# Patient Record
Sex: Male | Born: 1960
Health system: Southern US, Community
[De-identification: ages and names within clinical notes are randomized; demographics above are authoritative.]

## PROBLEM LIST (undated history)

## (undated) DIAGNOSIS — K4090 Unilateral inguinal hernia, without obstruction or gangrene, not specified as recurrent: Secondary | ICD-10-CM

## (undated) DIAGNOSIS — R569 Unspecified convulsions: Secondary | ICD-10-CM

## (undated) HISTORY — DX: Unspecified convulsions: R56.9

---

## 1993-04-04 HISTORY — PX: HERNIA REPAIR: SHX51

## 1997-11-26 ENCOUNTER — Ambulatory Visit (HOSPITAL_BASED_OUTPATIENT_CLINIC_OR_DEPARTMENT_OTHER): Admission: RE | Admit: 1997-11-26 | Discharge: 1997-11-26 | Payer: Self-pay

## 2001-01-24 ENCOUNTER — Emergency Department (HOSPITAL_COMMUNITY): Admission: EM | Admit: 2001-01-24 | Discharge: 2001-01-24 | Payer: Self-pay | Admitting: *Deleted

## 2003-04-05 HISTORY — PX: SHOULDER SURGERY: SHX246

## 2003-05-11 ENCOUNTER — Emergency Department (HOSPITAL_COMMUNITY): Admission: EM | Admit: 2003-05-11 | Discharge: 2003-05-11 | Payer: Self-pay | Admitting: Emergency Medicine

## 2003-05-14 ENCOUNTER — Ambulatory Visit (HOSPITAL_COMMUNITY): Admission: RE | Admit: 2003-05-14 | Discharge: 2003-05-14 | Payer: Self-pay | Admitting: Specialist

## 2003-07-17 ENCOUNTER — Emergency Department (HOSPITAL_COMMUNITY): Admission: EM | Admit: 2003-07-17 | Discharge: 2003-07-17 | Payer: Self-pay | Admitting: Emergency Medicine

## 2003-11-07 ENCOUNTER — Emergency Department (HOSPITAL_COMMUNITY): Admission: EM | Admit: 2003-11-07 | Discharge: 2003-11-07 | Payer: Self-pay | Admitting: Emergency Medicine

## 2014-10-28 ENCOUNTER — Other Ambulatory Visit: Payer: Self-pay | Admitting: Surgery

## 2014-10-28 NOTE — H&P (Signed)
Kevin Short 10/28/2014 3:34 PM Location: [facility] Patient #: [id number] DOB: 10/26/1960 Single / Language: [place] / Race: White Male History of Present Illness Ardeth Sportsman MD; 10/28/2014 4:17 PM) Patient words: RIH.  The patient is a 54 year old male who presents with an inguinal hernia. Patient sent for surgical consultation by his primary care physician, Dr. Lacie Scotts with [facility]. Concern for RIGHT inguinal hernia. Active male. Felt sharp right groin pain after heavy lifting & pulling up some carpet at home late last year. Had open inguinal hernia by Dr Orson Slick in the past (?L). Patient felt some pain and discomfort then started noticing a bulge. He went from being intermittently out all the time. It is gotten larger. He was concerned. Establish with primary care physician. Surgical consultation recommended. Patient has bowel movement every day. No severe constipation or diarrhea. Moderately active. Does not smoke. No other surgeries. No history of skin infections or other abnormalities. No difficulty with urination or prostate problems. Other Problems Fay Records, CMA; 10/28/2014 3:34 PM) Inguinal Hernia  Past Surgical History Fay Records, CMA; 10/28/2014 3:34 PM) Open Inguinal Hernia Surgery Left. Oral Surgery Shoulder Surgery Right.  Diagnostic Studies History Fay Records, [place]; 10/28/2014 3:34 PM) Colonoscopy never  Allergies Fay Records, CMA; 10/28/2014 3:34 PM) No Known Drug Allergies 10/28/2014  Medication History Fay Records, [place]; 10/28/2014 3:34 PM) No Current Medications Medications Reconciled  Social History Fay Records, [place]; 10/28/2014 3:34 PM) Alcohol use Occasional alcohol use. Caffeine use Coffee, Tea. No drug use Tobacco use Never smoker.  Family History Fay Records, [place]; 10/28/2014 3:34 PM) Heart Disease Father, Mother. Heart disease in male family member before age 65     Review of Systems  Fay Records CMA; 10/28/2014 3:34 PM) General Not Present- Appetite Loss, Chills, Fatigue, Fever, Night Sweats, Weight Gain and Weight Loss. Skin Not Present- Change in Wart/Mole, Dryness, Hives, Jaundice, New Lesions, Non-Healing Wounds, Rash and Ulcer. HEENT Present- Seasonal Allergies and Wears glasses/contact lenses. Not Present- Earache, Hearing Loss, Hoarseness, Nose Bleed, Oral Ulcers, Ringing in the Ears, Sinus Pain, Sore Throat, Visual Disturbances and Yellow Eyes. Respiratory Not Present- Bloody sputum, Chronic Cough, Difficulty Breathing, Snoring and Wheezing. Breast Not Present- Breast Mass, Breast Pain, Nipple Discharge and Skin Changes. Cardiovascular Not Present- Chest Pain, Difficulty Breathing Lying Down, Leg Cramps, Palpitations, Rapid Heart Rate, Shortness of Breath and Swelling of Extremities. Gastrointestinal Not Present- Abdominal Pain, Bloating, Bloody Stool, Change in Bowel Habits, Chronic diarrhea, Constipation, Difficulty Swallowing, Excessive gas, Gets full quickly at meals, Hemorrhoids, Indigestion, Nausea, Rectal Pain and Vomiting. Male Genitourinary Not Present- Blood in Urine, Change in Urinary Stream, Frequency, Impotence, Nocturia, Painful Urination, Urgency and Urine Leakage. Musculoskeletal Not Present- Back Pain, Joint Pain, Joint Stiffness, Muscle Pain, Muscle Weakness and Swelling of Extremities. Neurological Not Present- Decreased Memory, Fainting, Headaches, Numbness, Seizures, Tingling, Tremor, Trouble walking and Weakness. Psychiatric Not Present- Anxiety, Bipolar, Change in Sleep Pattern, Depression, Fearful and Frequent crying. Endocrine Not Present- Cold Intolerance, Excessive Hunger, Hair Changes, Heat Intolerance, Hot flashes and New Diabetes. Hematology Not Present- Easy Bruising, Excessive bleeding, Gland problems, HIV and Persistent Infections.  Vitals Fay Records CMA; 10/28/2014 3:35 PM) 10/28/2014 3:34 PM Weight: 193 lb Height: 71in Body  Surface Area: 2.09 m Body Mass Index: 26.92 kg/m Temp.: 98.81F(Oral)  Pulse: 69 (Regular)  BP: 128/72 (Sitting, Left Arm, Standard)     Physical Exam Ardeth Sportsman MD; 10/28/2014 4:18 PM)  General Mental Status-Alert. General Appearance-Not in acute distress, Not  Sickly. Orientation-Oriented X3. Hydration-Well hydrated. Voice-Normal.  Integumentary Global Assessment Upon inspection and palpation of skin surfaces of the - Axillae: non-tender, no inflammation or ulceration, no drainage. and Distribution of scalp and body hair is normal. General Characteristics Temperature - normal warmth is noted.  Head and Neck Head-normocephalic, atraumatic with no lesions or palpable masses. Face Global Assessment - atraumatic, no absence of expression. Neck Global Assessment - no abnormal movements, no bruit auscultated on the right, no bruit auscultated on the left, no decreased range of motion, non-tender. Trachea-midline. Thyroid Gland Characteristics - non-tender.  Eye Eyeball - Left-Extraocular movements intact, No Nystagmus. Eyeball - Right-Extraocular movements intact, No Nystagmus. Cornea - Left-No Hazy. Cornea - Right-No Hazy. Sclera/Conjunctiva - Left-No scleral icterus, No Discharge. Sclera/Conjunctiva - Right-No scleral icterus, No Discharge. Pupil - Left-Direct reaction to light normal. Pupil - Right-Direct reaction to light normal.  ENMT Ears Pinna - Left - no drainage observed, no generalized tenderness observed. Right - no drainage observed, no generalized tenderness observed. Nose and Sinuses External Inspection of the Nose - no destructive lesion observed. Inspection of the nares - Left - quiet respiration. Right - quiet respiration. Mouth and Throat Lips - Upper Lip - no fissures observed, no pallor noted. Lower Lip - no fissures observed, no pallor noted. Nasopharynx - no discharge present. Oral Cavity/Oropharynx - Tongue  - no dryness observed. Oral Mucosa - no cyanosis observed. Hypopharynx - no evidence of airway distress observed.  Chest and Lung Exam Inspection Movements - Normal and Symmetrical. Accessory muscles - No use of accessory muscles in breathing. Palpation Palpation of the chest reveals - Non-tender. Auscultation Breath sounds - Normal and Clear.  Cardiovascular Auscultation Rhythm - Regular. Murmurs & Other Heart Sounds - Auscultation of the heart reveals - No Murmurs and No Systolic Clicks.  Abdomen Inspection Inspection of the abdomen reveals - No Visible peristalsis and No Abnormal pulsations. Umbilicus - No Bleeding, No Urine drainage. Palpation/Percussion Palpation and Percussion of the abdomen reveal - Soft, Non Tender, No Rebound tenderness, No Rigidity (guarding) and No Cutaneous hyperesthesia. Note: Soft and flat. No diastases recti. No umbilical hernia.   Male Genitourinary Sexual Maturity Tanner 5 - Adult hair pattern and Adult penile size and shape. Note: Subtle LEFT groin incision. No LEFT inguinal hernia. Obvious RIGHT groin bulge. Ultimately reducible. Consistent with moderate RIGHT inguinal hernia. Testes and epididymides and spermatic cords appeared normal without any discrete masses or concerns.   Peripheral Vascular Upper Extremity Inspection - Left - No Cyanotic nailbeds, Not Ischemic. Right - No Cyanotic nailbeds, Not Ischemic.  Neurologic Neurologic evaluation reveals -normal attention span and ability to concentrate, able to name objects and repeat phrases. Appropriate fund of knowledge , normal sensation and normal coordination. Mental Status Affect - not angry, not paranoid. Cranial Nerves-Normal Bilaterally. Gait-Normal.  Neuropsychiatric Mental status exam performed with findings of-able to articulate well with normal speech/language, rate, volume and coherence, thought content normal with ability to perform basic computations and apply  abstract reasoning and no evidence of hallucinations, delusions, obsessions or homicidal/suicidal ideation.  Musculoskeletal Global Assessment Spine, Ribs and Pelvis - no instability, subluxation or laxity. Right Upper Extremity - no instability, subluxation or laxity.  Lymphatic Head & Neck  General Head & Neck Lymphatics: Bilateral - Description - No Localized lymphadenopathy. Axillary  General Axillary Region: Bilateral - Description - No Localized lymphadenopathy. Femoral & Inguinal  Generalized Femoral & Inguinal Lymphatics: Left - Description - No Localized lymphadenopathy. Right - Description - No Localized lymphadenopathy.  Assessment & Plan Ardeth Sportsman MD; 10/28/2014 4:19 PM)  RIGHT INGUINAL HERNIA (550.90  K40.90) Impression: Obvious RIGHT inguinal hernia. No evidence of recurrent LEFT inguinal hernia. I think he would benefit from surgical repair given his activity level, moderate size, symptoms, and relatively young age. He is interested in surgery. Would plan to do laparoscopically.  The anatomy & physiology of the abdominal wall and pelvic floor was discussed.  The pathophysiology of hernias in the inguinal and pelvic region was discussed.  Natural history risks such as progressive enlargement, pain, incarceration, and strangulation was discussed.   Contributors to complications such as smoking, obesity, diabetes, prior surgery, etc were discussed.    I feel the risks of no intervention will lead to serious problems that outweigh the operative risks; therefore, I recommended surgery to reduce and repair the hernia.  I explained laparoscopic techniques with possible need for an open approach.  I noted usual use of mesh to patch and/or buttress hernia repair  Risks such as bleeding, infection, abscess, need for further treatment, stroke, heart attack, death, and other risks were discussed.  I noted a good likelihood this will help address the problem.   Goals of  post-operative recovery were discussed as well.  Possibility that this will not correct all symptoms was explained.  I stressed the importance of low-impact activity, aggressive pain control, avoiding constipation, & not pushing through pain to minimize risk of post-operative chronic pain or injury. Possibility of reherniation was discussed.  We will work to minimize complications.     An educational handout further explaining the pathology & treatment options was given as well.  Questions were answered.  The patient expresses understanding & wishes to proceed with surgery.    Current Plans Schedule for Surgery Written instructions provided Pt Education - Pamphlet Given - Laparoscopic Hernia Repair: discussed with patient and provided information. Pt Education - [facility] Hernia Post-Op HCI (Dae Highley): discussed with patient and provided information. Pt Education - [facility] Pain Control (Darrah Dredge)  Ardeth Sportsman, M.D., F.A.C.S. Gastrointestinal and Minimally Invasive Surgery [facility] [address] [place], [place] [address] [phone] Main / Paging

## 2014-12-05 NOTE — Patient Instructions (Addendum)
YOUR PROCEDURE IS SCHEDULED ON :  12/11/14  REPORT TO [facility] MAIN ENTRANCE FOLLOW SIGNS TO EAST ELEVATOR - GO TO 3rd FLOOR CHECK IN AT 3 EAST NURSES STATION (SHORT STAY) AT:  5:30 am  CALL THIS NUMBER IF YOU HAVE PROBLEMS THE MORNING OF SURGERY [phone]  REMEMBER:ONLY 1 PER PERSON MAY GO TO SHORT STAY WITH YOU TO GET READY THE MORNING OF YOUR SURGERY  DO NOT EAT FOOD OR DRINK LIQUIDS AFTER MIDNIGHT  TAKE THESE MEDICINES THE MORNING OF SURGERY: NONE  STOP ASPIRIN / IBUPROFEN / ALEVE / VITAMINS / HERBAL MEDS __5__ DAYS BEFORE SURGERY  YOU MAY NOT HAVE ANY METAL ON YOUR BODY INCLUDING HAIR PINS AND PIERCING'S. DO NOT WEAR JEWELRY, MAKEUP, LOTIONS, POWDERS OR PERFUMES. DO NOT WEAR NAIL POLISH. DO NOT SHAVE 48 HRS PRIOR TO SURGERY. MEN MAY SHAVE FACE AND NECK.  DO NOT BRING VALUABLES TO HOSPITAL. [facility] IS NOT RESPONSIBLE FOR VALUABLES.  CONTACTS, DENTURES OR PARTIALS MAY NOT BE WORN TO SURGERY. LEAVE SUITCASE IN CAR. CAN BE BROUGHT TO ROOM AFTER SURGERY.  PATIENTS DISCHARGED THE DAY OF SURGERY WILL NOT BE ALLOWED TO DRIVE HOME.  PLEASE READ OVER THE FOLLOWING INSTRUCTION SHEETS _________________________________________________________________________________                                          [place] - PREPARING FOR SURGERY  Before surgery, you can play an important role.  Because skin is not sterile, your skin needs to be as free of germs as possible.  You can reduce the number of germs on your skin by washing with CHG (chlorahexidine gluconate) soap before surgery.  CHG is an antiseptic cleaner which kills germs and bonds with the skin to continue killing germs even after washing. Please DO NOT use if you have an allergy to CHG or antibacterial soaps.  If your skin becomes reddened/irritated stop using the CHG and inform your nurse when you arrive at Short Stay. Do not shave (including legs and underarms) for at least 48 hours prior to the  first CHG shower.  You may shave your face. Please follow these instructions carefully:   1.  Shower with CHG Soap the night before surgery and the  morning of Surgery.   2.  If you choose to wash your hair, wash your hair first as usual with your  normal  Shampoo.   3.  After you shampoo, rinse your hair and body thoroughly to remove the  shampoo.                                         4.  Use CHG as you would any other liquid soap.  You can apply chg directly  to the skin and wash . Gently wash with scrungie or clean wascloth    5.  Apply the CHG Soap to your body ONLY FROM THE NECK DOWN.   Do not use on open                           Wound or open sores. Avoid contact with eyes, ears mouth and genitals (private parts).  Genitals (private parts) with your normal soap.              6.  Wash thoroughly, paying special attention to the area where your surgery  will be performed.   7.  Thoroughly rinse your body with warm water from the neck down.   8.  DO NOT shower/wash with your normal soap after using and rinsing off  the CHG Soap .                9.  Pat yourself dry with a clean towel.             10.  Wear clean night clothes to bed after shower             11.  Place clean sheets on your bed the night of your first shower and do not  sleep with pets.  Day of Surgery : Do not apply any lotions/deodorants the morning of surgery.  Please wear clean clothes to the hospital/surgery center.  FAILURE TO FOLLOW THESE INSTRUCTIONS MAY RESULT IN THE CANCELLATION OF YOUR SURGERY    PATIENT SIGNATURE_________________________________  ______________________________________________________________________

## 2014-12-09 ENCOUNTER — Encounter (HOSPITAL_COMMUNITY): Payer: Self-pay

## 2014-12-09 ENCOUNTER — Encounter (HOSPITAL_COMMUNITY)
Admission: RE | Admit: 2014-12-09 | Discharge: 2014-12-09 | Disposition: A | Discharge status: Home / Self Care | Payer: 59 | Source: Ambulatory Visit | Attending: Surgery | Admitting: Surgery

## 2014-12-09 DIAGNOSIS — K4120 Bilateral femoral hernia, without obstruction or gangrene, not specified as recurrent: Secondary | ICD-10-CM | POA: Diagnosis not present

## 2014-12-09 DIAGNOSIS — K4020 Bilateral inguinal hernia, without obstruction or gangrene, not specified as recurrent: Secondary | ICD-10-CM | POA: Diagnosis not present

## 2014-12-09 DIAGNOSIS — K4090 Unilateral inguinal hernia, without obstruction or gangrene, not specified as recurrent: Secondary | ICD-10-CM | POA: Diagnosis present

## 2014-12-09 HISTORY — DX: Unilateral inguinal hernia, without obstruction or gangrene, not specified as recurrent: K40.90

## 2014-12-09 LAB — CBC
HEMATOCRIT: 45.1 % (ref 39.0–52.0)
HEMOGLOBIN: 15.2 g/dL (ref 13.0–17.0)
MCH: 32 pg (ref 26.0–34.0)
MCHC: 33.7 g/dL (ref 30.0–36.0)
MCV: 94.9 fL (ref 78.0–100.0)
Platelets: 221 10*3/uL (ref 150–400)
RBC: 4.75 MIL/uL (ref 4.22–5.81)
RDW: 11.9 % (ref 11.5–15.5)
WBC: 4.6 10*3/uL (ref 4.0–10.5)

## 2014-12-11 ENCOUNTER — Ambulatory Visit (HOSPITAL_COMMUNITY): Payer: 59 | Admitting: Anesthesiology

## 2014-12-11 ENCOUNTER — Encounter (HOSPITAL_COMMUNITY)
Admission: RE | Disposition: A | Discharge status: Home / Self Care | Payer: Self-pay | Source: Ambulatory Visit | Attending: Surgery

## 2014-12-11 ENCOUNTER — Ambulatory Visit (HOSPITAL_COMMUNITY)
Admission: RE | Admit: 2014-12-11 | Discharge: 2014-12-11 | Disposition: A | Discharge status: Home / Self Care | Payer: 59 | Source: Ambulatory Visit | Attending: Surgery | Admitting: Surgery

## 2014-12-11 ENCOUNTER — Encounter (HOSPITAL_COMMUNITY): Payer: Self-pay | Admitting: *Deleted

## 2014-12-11 DIAGNOSIS — K4120 Bilateral femoral hernia, without obstruction or gangrene, not specified as recurrent: Secondary | ICD-10-CM | POA: Insufficient documentation

## 2014-12-11 DIAGNOSIS — K4020 Bilateral inguinal hernia, without obstruction or gangrene, not specified as recurrent: Secondary | ICD-10-CM | POA: Insufficient documentation

## 2014-12-11 HISTORY — PX: INGUINAL HERNIA REPAIR: SHX194

## 2014-12-11 HISTORY — PX: INSERTION OF MESH: SHX5868

## 2014-12-11 SURGERY — INSERTION OF MESH
Anesthesia: General | Site: Groin | Laterality: Bilateral

## 2014-12-11 MED ORDER — ROCURONIUM BROMIDE 100 MG/10ML IV SOLN
INTRAVENOUS | Status: DC | PRN
  Administered 2014-12-11: 50 mg via INTRAVENOUS

## 2014-12-11 MED ORDER — MIDAZOLAM HCL 2 MG/2ML IJ SOLN
INTRAMUSCULAR | Status: AC
  Filled 2014-12-11: qty 4

## 2014-12-11 MED ORDER — CHLORHEXIDINE GLUCONATE 4 % EX LIQD: 1.0000 "application " | Freq: Once | CUTANEOUS | Status: DC

## 2014-12-11 MED ORDER — MIDAZOLAM HCL 5 MG/5ML IJ SOLN
INTRAMUSCULAR | Status: DC | PRN
  Administered 2014-12-11: 2 mg via INTRAVENOUS

## 2014-12-11 MED ORDER — BUPIVACAINE-EPINEPHRINE (PF) 0.25% -1:200000 IJ SOLN
INTRAMUSCULAR | Status: AC
  Filled 2014-12-11: qty 30

## 2014-12-11 MED ORDER — CEFAZOLIN SODIUM-DEXTROSE 2-3 GM-% IV SOLR
INTRAVENOUS | Status: AC
  Filled 2014-12-11: qty 50

## 2014-12-11 MED ORDER — DEXAMETHASONE SODIUM PHOSPHATE 10 MG/ML IJ SOLN
INTRAMUSCULAR | Status: AC
  Filled 2014-12-11: qty 1

## 2014-12-11 MED ORDER — KETOROLAC TROMETHAMINE 30 MG/ML IJ SOLN
INTRAMUSCULAR | Status: DC | PRN
  Administered 2014-12-11: 30 mg via INTRAVENOUS

## 2014-12-11 MED ORDER — FENTANYL CITRATE (PF) 250 MCG/5ML IJ SOLN
INTRAMUSCULAR | Status: AC
  Filled 2014-12-11: qty 25

## 2014-12-11 MED ORDER — BUPIVACAINE-EPINEPHRINE 0.25% -1:200000 IJ SOLN
INTRAMUSCULAR | Status: AC
  Filled 2014-12-11: qty 1

## 2014-12-11 MED ORDER — GLYCOPYRROLATE 0.2 MG/ML IJ SOLN
INTRAMUSCULAR | Status: DC | PRN
  Administered 2014-12-11 (×2): 0.2 mg via INTRAVENOUS

## 2014-12-11 MED ORDER — NEOSTIGMINE METHYLSULFATE 10 MG/10ML IV SOLN
INTRAVENOUS | Status: DC | PRN
  Administered 2014-12-11: 2.5 mg via INTRAVENOUS

## 2014-12-11 MED ORDER — FENTANYL CITRATE (PF) 100 MCG/2ML IJ SOLN
25.0000 ug | INTRAMUSCULAR | Status: DC | PRN
  Administered 2014-12-11 (×2): 50 ug via INTRAVENOUS

## 2014-12-11 MED ORDER — LACTATED RINGERS IV SOLN: INTRAVENOUS | Status: DC

## 2014-12-11 MED ORDER — PROPOFOL 10 MG/ML IV BOLUS
INTRAVENOUS | Status: AC
  Filled 2014-12-11: qty 20

## 2014-12-11 MED ORDER — ROCURONIUM BROMIDE 100 MG/10ML IV SOLN
INTRAVENOUS | Status: AC
  Filled 2014-12-11: qty 1

## 2014-12-11 MED ORDER — DEXAMETHASONE SODIUM PHOSPHATE 10 MG/ML IJ SOLN
INTRAMUSCULAR | Status: DC | PRN
  Administered 2014-12-11: 10 mg via INTRAVENOUS

## 2014-12-11 MED ORDER — ATROPINE SULFATE 0.1 MG/ML IJ SOLN
INTRAMUSCULAR | Status: AC
  Filled 2014-12-11: qty 10

## 2014-12-11 MED ORDER — ATROPINE SULFATE 0.4 MG/ML IJ SOLN
INTRAMUSCULAR | Status: DC | PRN
  Administered 2014-12-11: .5 mg via INTRAVENOUS

## 2014-12-11 MED ORDER — FENTANYL CITRATE (PF) 100 MCG/2ML IJ SOLN
INTRAMUSCULAR | Status: DC
Start: 2014-12-11 — End: 2014-12-11
  Filled 2014-12-11: qty 2

## 2014-12-11 MED ORDER — STERILE WATER FOR IRRIGATION IR SOLN
Status: DC | PRN
  Administered 2014-12-11: 2000 mL

## 2014-12-11 MED ORDER — LIDOCAINE HCL (CARDIAC) 20 MG/ML IV SOLN
INTRAVENOUS | Status: DC | PRN
Start: 2014-12-11 — End: 2014-12-11
  Administered 2014-12-11: 100 mg via INTRAVENOUS

## 2014-12-11 MED ORDER — CEFAZOLIN SODIUM-DEXTROSE 2-3 GM-% IV SOLR
2.0000 g | INTRAVENOUS | Status: AC
  Administered 2014-12-11: 2 g via INTRAVENOUS

## 2014-12-11 MED ORDER — LIDOCAINE HCL (CARDIAC) 20 MG/ML IV SOLN
INTRAVENOUS | Status: AC
  Filled 2014-12-11: qty 5

## 2014-12-11 MED ORDER — NAPROXEN 500 MG PO TABS: 500.0000 mg | ORAL_TABLET | Freq: Two times a day (BID) | ORAL | Status: DC

## 2014-12-11 MED ORDER — ONDANSETRON HCL 4 MG/2ML IJ SOLN
INTRAMUSCULAR | Status: AC
  Filled 2014-12-11: qty 2

## 2014-12-11 MED ORDER — FENTANYL CITRATE (PF) 100 MCG/2ML IJ SOLN
INTRAMUSCULAR | Status: DC | PRN
  Administered 2014-12-11: 50 ug via INTRAVENOUS
  Administered 2014-12-11 (×2): 100 ug via INTRAVENOUS

## 2014-12-11 MED ORDER — OXYCODONE HCL 5 MG PO TABS: 5.0000 mg | ORAL_TABLET | ORAL | Status: DC | PRN

## 2014-12-11 MED ORDER — PROPOFOL 10 MG/ML IV BOLUS
INTRAVENOUS | Status: DC | PRN
  Administered 2014-12-11: 200 mg via INTRAVENOUS

## 2014-12-11 MED ORDER — LACTATED RINGERS IV SOLN
INTRAVENOUS | Status: DC | PRN
Start: 2014-12-11 — End: 2014-12-11
  Administered 2014-12-11: 07:00:00 via INTRAVENOUS

## 2014-12-11 MED ORDER — BUPIVACAINE-EPINEPHRINE 0.25% -1:200000 IJ SOLN
INTRAMUSCULAR | Status: DC | PRN
  Administered 2014-12-11: 89 mL

## 2014-12-11 SURGICAL SUPPLY — 30 items
CABLE HIGH FREQUENCY MONO STRZ (ELECTRODE) ×2 IMPLANT
CHLORAPREP W/TINT 26ML (MISCELLANEOUS) ×3 IMPLANT
COVER SURGICAL LIGHT HANDLE (MISCELLANEOUS) ×1 IMPLANT
DECANTER SPIKE VIAL GLASS SM (MISCELLANEOUS) ×3 IMPLANT
DEVICE SECURE STRAP 25 ABSORB (INSTRUMENTS) IMPLANT
DRAPE LAPAROSCOPIC ABDOMINAL (DRAPES) ×3 IMPLANT
DRAPE WARM FLUID 44X44 (DRAPE) ×3 IMPLANT
DRSG TEGADERM 2-3/8X2-3/4 SM (GAUZE/BANDAGES/DRESSINGS) ×4 IMPLANT
DRSG TEGADERM 4X4.75 (GAUZE/BANDAGES/DRESSINGS) ×3 IMPLANT
ELECT REM PT RETURN 9FT ADLT (ELECTROSURGICAL) ×3
ELECTRODE REM PT RTRN 9FT ADLT (ELECTROSURGICAL) ×2 IMPLANT
GAUZE SPONGE 2X2 8PLY STRL LF (GAUZE/BANDAGES/DRESSINGS) ×1 IMPLANT
GLOVE ECLIPSE 8.0 STRL XLNG CF (GLOVE) ×3 IMPLANT
GLOVE INDICATOR 8.0 STRL GRN (GLOVE) ×3 IMPLANT
GOWN STRL REUS W/TWL XL LVL3 (GOWN DISPOSABLE) ×8 IMPLANT
KIT BASIN OR (CUSTOM PROCEDURE TRAY) ×3 IMPLANT
MESH ULTRAPRO 6X6 15CM15CM (Mesh General) ×4 IMPLANT
PEN SKIN MARKING BROAD (MISCELLANEOUS) ×3 IMPLANT
SCISSORS LAP 5X35 DISP (ENDOMECHANICALS) ×3 IMPLANT
SET IRRIG TUBING LAPAROSCOPIC (IRRIGATION / IRRIGATOR) IMPLANT
SLEEVE XCEL OPT CAN 5 100 (ENDOMECHANICALS) ×3 IMPLANT
SPONGE GAUZE 2X2 STER 10/PKG (GAUZE/BANDAGES/DRESSINGS) ×1
SUT MNCRL AB 4-0 PS2 18 (SUTURE) ×3 IMPLANT
SUT VIC AB 3-0 SH 27 (SUTURE) ×6
SUT VIC AB 3-0 SH 27XBRD (SUTURE) ×2 IMPLANT
TACKER 5MM HERNIA 3.5CML NAB (ENDOMECHANICALS) IMPLANT
TOWEL OR 17X26 10 PK STRL BLUE (TOWEL DISPOSABLE) ×3 IMPLANT
TRAY LAPAROSCOPIC (CUSTOM PROCEDURE TRAY) ×3 IMPLANT
TROCAR BLADELESS OPT 5 100 (ENDOMECHANICALS) ×3 IMPLANT
TROCAR XCEL BLUNT TIP 100MML (ENDOMECHANICALS) ×3 IMPLANT

## 2014-12-11 NOTE — Anesthesia Postprocedure Evaluation (Signed)
  Anesthesia Post-op Note  Patient: Kevin Short  Procedure(s) Performed: Procedure(s) (LRB): INSERTION OF MESH (Bilateral) LAPAROSCOPIC BILATERAL INGUINAL AND BILATERAL FEMEROL  HERNIA  REPAIRS WITH MESH (Bilateral)  Patient Location: PACU  Anesthesia Type: General  Level of Consciousness: awake and alert   Airway and Oxygen Therapy: Patient Spontanous Breathing  Post-op Pain: mild  Post-op Assessment: Post-op Vital signs reviewed, Patient's Cardiovascular Status Stable, Respiratory Function Stable, Patent Airway and No signs of Nausea or vomiting  Last Vitals:  Filed Vitals:   12/11/14 1045  BP: 143/81  Pulse: 62  Temp:   Resp: 16    Post-op Vital Signs: stable   Complications: No apparent anesthesia complications

## 2014-12-11 NOTE — Anesthesia Preprocedure Evaluation (Addendum)
Anesthesia Evaluation  Patient identified by MRN, date of birth, ID band Patient awake    Reviewed: Allergy & Precautions, H&P , NPO status , Patient's Chart, lab work & pertinent test results  Airway Mallampati: II  TM Distance: >3 FB Neck ROM: full    Dental no notable dental hx. (+) Dental Advisory Given, Teeth Intact   Pulmonary neg pulmonary ROS,    Pulmonary exam normal breath sounds clear to auscultation       Cardiovascular Exercise Tolerance: Good negative cardio ROS Normal cardiovascular exam Rhythm:regular Rate:Normal     Neuro/Psych negative neurological ROS  negative psych ROS   GI/Hepatic negative GI ROS, Neg liver ROS,   Endo/Other  negative endocrine ROS  Renal/GU negative Renal ROS  negative genitourinary   Musculoskeletal   Abdominal   Peds  Hematology negative hematology ROS (+)   Anesthesia Other Findings   Reproductive/Obstetrics negative OB ROS                             Anesthesia Physical Anesthesia Plan  ASA: I  Anesthesia Plan: General   Post-op Pain Management:    Induction: Intravenous  Airway Management Planned: LMA  Additional Equipment:   Intra-op Plan:   Post-operative Plan:   Informed Consent: I have reviewed the patients History and Physical, chart, labs and discussed the procedure including the risks, benefits and alternatives for the proposed anesthesia with the patient or authorized representative who has indicated his/her understanding and acceptance.   Dental Advisory Given  Plan Discussed with: CRNA and Surgeon  Anesthesia Plan Comments:         Anesthesia Quick Evaluation  

## 2014-12-11 NOTE — H&P (Signed)
Kevin Short 10/28/2014 3:34 PM Location: [facility] Patient #: [id number] DOB: 27-Feb-1961 Single / Language: [place] / Race: White Male  History of Present Illness  Patient words: RIH.  The patient is a 54 year old male who presents with an inguinal hernia. Patient sent for surgical consultation by his primary care physician, Dr. Lacie Scotts with [facility]. Concern for RIGHT inguinal hernia. Active male. Felt sharp right groin pain after heavy lifting & pulling up some carpet at home late last year. Had open inguinal hernia by Dr Orson Slick in the past (?L). Patient felt some pain and discomfort then started noticing a bulge. He went from being intermittently out all the time. It is gotten larger. He was concerned. Establish with primary care physician. Surgical consultation recommended. Patient has bowel movement every day. No severe constipation or diarrhea. Moderately active. Does not smoke. No other surgeries. No history of skin infections or other abnormalities. No difficulty with urination or prostate problems.   Other Problems Fay Records, CMA; 10/28/2014 3:34 PM) Inguinal Hernia  Past Surgical History Fay Records, CMA; 10/28/2014 3:34 PM) Open Inguinal Hernia Surgery Left. Oral Surgery Shoulder Surgery Right.  Diagnostic Studies History Fay Records, [place]; 10/28/2014 3:34 PM) Colonoscopy never  Allergies Fay Records, CMA; 10/28/2014 3:34 PM) No Known Drug Allergies07/26/2016  Medication History Fay Records, [place]; 10/28/2014 3:34 PM) No Current Medications Medications Reconciled  Social History Fay Records, [place]; 10/28/2014 3:34 PM) Alcohol use Occasional alcohol use. Caffeine use Coffee, Tea. No drug use Tobacco use Never smoker.  Family History Fay Records, [place]; 10/28/2014 3:34 PM) Heart Disease Father, Mother. Heart disease in male family member before age 55  Review of Systems Fay Records CMA; 10/28/2014 3:34  PM) General Not Present- Appetite Loss, Chills, Fatigue, Fever, Night Sweats, Weight Gain and Weight Loss. Skin Not Present- Change in Wart/Mole, Dryness, Hives, Jaundice, New Lesions, Non-Healing Wounds, Rash and Ulcer. HEENT Present- Seasonal Allergies and Wears glasses/contact lenses. Not Present- Earache, Hearing Loss, Hoarseness, Nose Bleed, Oral Ulcers, Ringing in the Ears, Sinus Pain, Sore Throat, Visual Disturbances and Yellow Eyes. Respiratory Not Present- Bloody sputum, Chronic Cough, Difficulty Breathing, Snoring and Wheezing. Breast Not Present- Breast Mass, Breast Pain, Nipple Discharge and Skin Changes. Cardiovascular Not Present- Chest Pain, Difficulty Breathing Lying Down, Leg Cramps, Palpitations, Rapid Heart Rate, Shortness of Breath and Swelling of Extremities. Gastrointestinal Not Present- Abdominal Pain, Bloating, Bloody Stool, Change in Bowel Habits, Chronic diarrhea, Constipation, Difficulty Swallowing, Excessive gas, Gets full quickly at meals, Hemorrhoids, Indigestion, Nausea, Rectal Pain and Vomiting. Male Genitourinary Not Present- Blood in Urine, Change in Urinary Stream, Frequency, Impotence, Nocturia, Painful Urination, Urgency and Urine Leakage. Musculoskeletal Not Present- Back Pain, Joint Pain, Joint Stiffness, Muscle Pain, Muscle Weakness and Swelling of Extremities. Neurological Not Present- Decreased Memory, Fainting, Headaches, Numbness, Seizures, Tingling, Tremor, Trouble walking and Weakness. Psychiatric Not Present- Anxiety, Bipolar, Change in Sleep Pattern, Depression, Fearful and Frequent crying. Endocrine Not Present- Cold Intolerance, Excessive Hunger, Hair Changes, Heat Intolerance, Hot flashes and New Diabetes. Hematology Not Present- Easy Bruising, Excessive bleeding, Gland problems, HIV and Persistent Infections.   Vitals Fay Records CMA; 10/28/2014 3:35 PM) 10/28/2014 3:34 PM Weight: 193 lb Height: 71in Body Surface Area: 2.09 m Body Mass  Index: 26.92 kg/m Temp.: 98.51F(Oral)  Pulse: 69 (Regular)  BP: 128/72 (Sitting, Left Arm, Standard)    Physical Exam Ardeth Sportsman MD; 10/28/2014 4:18 PM) General Mental Status-Alert. General Appearance-Not in acute distress, Not Sickly. Orientation-Oriented X3. Hydration-Well hydrated. Voice-Normal.  Integumentary  Global Assessment Upon inspection and palpation of skin surfaces of the - Axillae: non-tender, no inflammation or ulceration, no drainage. and Distribution of scalp and body hair is normal. General Characteristics Temperature - normal warmth is noted.  Head and Neck Head-normocephalic, atraumatic with no lesions or palpable masses. Face Global Assessment - atraumatic, no absence of expression. Neck Global Assessment - no abnormal movements, no bruit auscultated on the right, no bruit auscultated on the left, no decreased range of motion, non-tender. Trachea-midline. Thyroid Gland Characteristics - non-tender.  Eye Eyeball - Left-Extraocular movements intact, No Nystagmus. Eyeball - Right-Extraocular movements intact, No Nystagmus. Cornea - Left-No Hazy. Cornea - Right-No Hazy. Sclera/Conjunctiva - Left-No scleral icterus, No Discharge. Sclera/Conjunctiva - Right-No scleral icterus, No Discharge. Pupil - Left-Direct reaction to light normal. Pupil - Right-Direct reaction to light normal.  ENMT Ears Pinna - Left - no drainage observed, no generalized tenderness observed. Right - no drainage observed, no generalized tenderness observed. Nose and Sinuses External Inspection of the Nose - no destructive lesion observed. Inspection of the nares - Left - quiet respiration. Right - quiet respiration. Mouth and Throat Lips - Upper Lip - no fissures observed, no pallor noted. Lower Lip - no fissures observed, no pallor noted. Nasopharynx - no discharge present. Oral Cavity/Oropharynx - Tongue - no dryness observed. Oral Mucosa -  no cyanosis observed. Hypopharynx - no evidence of airway distress observed.  Chest and Lung Exam Inspection Movements - Normal and Symmetrical. Accessory muscles - No use of accessory muscles in breathing. Palpation Palpation of the chest reveals - Non-tender. Auscultation Breath sounds - Normal and Clear.  Cardiovascular Auscultation Rhythm - Regular. Murmurs & Other Heart Sounds - Auscultation of the heart reveals - No Murmurs and No Systolic Clicks.  Abdomen Inspection Inspection of the abdomen reveals - No Visible peristalsis and No Abnormal pulsations. Umbilicus - No Bleeding, No Urine drainage. Palpation/Percussion Palpation and Percussion of the abdomen reveal - Soft, Non Tender, No Rebound tenderness, No Rigidity (guarding) and No Cutaneous hyperesthesia. Note: Soft and flat. No diastases recti. No umbilical hernia.   Male Genitourinary Sexual Maturity Tanner 5 - Adult hair pattern and Adult penile size and shape. Note: Subtle LEFT groin incision. No LEFT inguinal hernia. Obvious RIGHT groin bulge. Ultimately reducible. Consistent with moderate RIGHT inguinal hernia. Testes and epididymides and spermatic cords appeared normal without any discrete masses or concerns.   Peripheral Vascular Upper Extremity Inspection - Left - No Cyanotic nailbeds, Not Ischemic. Right - No Cyanotic nailbeds, Not Ischemic.  Neurologic Neurologic evaluation reveals -normal attention span and ability to concentrate, able to name objects and repeat phrases. Appropriate fund of knowledge , normal sensation and normal coordination. Mental Status Affect - not angry, not paranoid. Cranial Nerves-Normal Bilaterally. Gait-Normal.  Neuropsychiatric Mental status exam performed with findings of-able to articulate well with normal speech/language, rate, volume and coherence, thought content normal with ability to perform basic computations and apply abstract reasoning and no evidence of  hallucinations, delusions, obsessions or homicidal/suicidal ideation.  Musculoskeletal Global Assessment Spine, Ribs and Pelvis - no instability, subluxation or laxity. Right Upper Extremity - no instability, subluxation or laxity.  Lymphatic Head & Neck  General Head & Neck Lymphatics: Bilateral - Description - No Localized lymphadenopathy. Axillary  General Axillary Region: Bilateral - Description - No Localized lymphadenopathy. Femoral & Inguinal  Generalized Femoral & Inguinal Lymphatics: Left - Description - No Localized lymphadenopathy. Right - Description - No Localized lymphadenopathy.    Assessment & Plan  RIGHT INGUINAL  HERNIA (550.90  K40.90) Impression: Obvious RIGHT inguinal hernia. No evidence of recurrent LEFT inguinal hernia. I think he would benefit from surgical repair given his activity level, moderate size, symptoms, and relatively young age. He is interested in surgery. Would plan to do laparoscopically. Current Plans  Schedule for Surgery Written instructions provided Pt Education - Pamphlet Given - Laparoscopic Hernia Repair: discussed with patient and provided information. The anatomy & physiology of the abdominal wall and pelvic floor was discussed. The pathophysiology of hernias in the inguinal and pelvic region was discussed. Natural history risks such as progressive enlargement, pain, incarceration, and strangulation was discussed. Contributors to complications such as smoking, obesity, diabetes, prior surgery, etc were discussed.  I feel the risks of no intervention will lead to serious problems that outweigh the operative risks; therefore, I recommended surgery to reduce and repair the hernia. I explained laparoscopic techniques with possible need for an open approach. I noted usual use of mesh to patch and/or buttress hernia repair  Risks such as bleeding, infection, abscess, need for further treatment, heart attack, death, and other risks were  discussed. I noted a good likelihood this will help address the problem. Goals of post-operative recovery were discussed as well. Possibility that this will not correct all symptoms was explained. I stressed the importance of low-impact activity, aggressive pain control, avoiding constipation, & not pushing through pain to minimize risk of post-operative chronic pain or injury. Possibility of reherniation was discussed. We will work to minimize complications.  An educational handout further explaining the pathology & treatment options was given as well. Questions were answered. The patient expresses understanding & wishes to proceed with surgery. Pt Education - [facility] Hernia Post-Op HCI (Kevin Short): discussed with patient and provided information. Pt Education - [facility] Pain Control (Kevin Short)   Signed by Ardeth Sportsman, MD   Ardeth Sportsman, M.D., F.A.C.S. Gastrointestinal and Minimally Invasive Surgery [facility] [address] [place], [place] [address] [phone] Main / Paging

## 2014-12-11 NOTE — Interval H&P Note (Signed)
History and Physical Interval Note:  12/11/2014 7:30 AM  Kevin Short  has presented today for surgery, with the diagnosis of Right Inguinal Hernia  The various methods of treatment have been discussed with the patient and family. After consideration of risks, benefits and other options for treatment, the patient has consented to  Procedure(s): LAPAROSCOPIC RIGHT INGUINAL HERNIA REPAIR (Right) as a surgical intervention .  The patient's history has been reviewed, patient examined, no change in status, stable for surgery.  I have reviewed the patient's chart and labs.  Questions were answered to the patient's satisfaction.     Aicia Babinski C.

## 2014-12-11 NOTE — Transfer of Care (Signed)
Immediate Anesthesia Transfer of Care Note  Patient: Kevin Short  Procedure(s) Performed: Procedure(s): INSERTION OF MESH (Bilateral) LAPAROSCOPIC BILATERAL INGUINAL AND BILATERAL FEMEROL  HERNIA  REPAIRS WITH MESH (Bilateral)  Patient Location: PACU  Anesthesia Type:General  Level of Consciousness: awake, alert  and oriented  Airway & Oxygen Therapy: Patient Spontanous Breathing and Patient connected to face mask oxygen  Post-op Assessment: Report given to RN and Post -op Vital signs reviewed and stable  Post vital signs: Reviewed and stable  Last Vitals:  Filed Vitals:   12/11/14 0544  BP: 138/90  Pulse: 62  Temp: 36.7 C  Resp: 18    Complications: No apparent anesthesia complications

## 2014-12-11 NOTE — Anesthesia Procedure Notes (Signed)
Procedure Name: Intubation Date/Time: 12/11/2014 7:43 AM Performed by: Thornell Mule Pre-anesthesia Checklist: Patient identified, Emergency Drugs available, Suction available and Patient being monitored Patient Re-evaluated:Patient Re-evaluated prior to inductionOxygen Delivery Method: Circle System Utilized Preoxygenation: Pre-oxygenation with 100% oxygen Intubation Type: IV induction Ventilation: Mask ventilation without difficulty Laryngoscope Size: Miller and 3 Grade View: Grade I Tube type: Oral Tube size: 7.5 mm Number of attempts: 1 Airway Equipment and Method: Stylet and Oral airway Placement Confirmation: ETT inserted through vocal cords under direct vision,  positive ETCO2 and breath sounds checked- equal and bilateral Secured at: 21 cm Tube secured with: Tape Dental Injury: Teeth and Oropharynx as per pre-operative assessment

## 2014-12-11 NOTE — Op Note (Signed)
12/11/2014  9:13 AM  PATIENT:  Kevin Short  54 y.o. male  Patient Care Team: Evelene Croon, MD as PCP - General (Family Medicine)  PRE-OPERATIVE DIAGNOSIS:    Right Inguinal Hernia  POST-OPERATIVE DIAGNOSIS:    Right indirect inguinal hernia Left direct inguinal hernia Bilateral femoral hernias  PROCEDURE:   INSERTION OF MESH LAPAROSCOPIC BILATERAL INGUINAL AND BILATERAL FEMORAL HERNIA  REPAIRS  SURGEON:  Surgeon(s): Karie Soda, MD  ASSISTANT: RN   ANESTHESIA:   Regional ilioinguinal and genitofemoral and spermatic cord nerve blocks with GETA  EBL:     Delay start of Pharmacological VTE agent (>24hrs) due to surgical blood loss or risk of bleeding:  no  DRAINS: NONE  SPECIMEN:  NONE  DISPOSITION OF SPECIMEN:  N/A  COUNTS:  YES  PLAN OF CARE: Discharge to home after PACU  PATIENT DISPOSITION:  PACU - hemodynamically stable.  INDICATION: Patient with symptomatic right inguinal hernia.  History of left inguinal hernia but no definite hernia there.  I recommended laparoscopic exploration and repair of hernias found.    The anatomy & physiology of the abdominal wall and pelvic floor was discussed.  The pathophysiology of hernias in the inguinal and pelvic region was discussed.  Natural history risks such as progressive enlargement, pain, incarceration & strangulation was discussed.   Contributors to complications such as smoking, obesity, diabetes, prior surgery, etc were discussed.    I feel the risks of no intervention will lead to serious problems that outweigh the operative risks; therefore, I recommended surgery to reduce and repair the hernia.  I explained laparoscopic techniques with possible need for an open approach.  I noted usual use of mesh to patch and/or buttress hernia repair  Risks such as bleeding, infection, abscess, need for further treatment, heart attack, death, and other risks were discussed.  I noted a good likelihood this will help  address the problem.   Goals of post-operative recovery were discussed as well.  Possibility that this will not correct all symptoms was explained.  I stressed the importance of low-impact activity, aggressive pain control, avoiding constipation, & not pushing through pain to minimize risk of post-operative chronic pain or injury. Possibility of reherniation was discussed.  We will work to minimize complications.     An educational handout further explaining the pathology & treatment options was given as well.  Questions were answered.  The patient expresses understanding & wishes to proceed with surgery.  OR FINDINGS: Patient had moderately large right indirect inguinal hernia.  Small but definite right femoral hernia.  No direct space nor obturator hernias.  On the left side, patient had a small but definite femoral hernia.  Small but definite direct inguinal hernia as well.  Evidence of prior indirect inguinal hernia repair with mesh.  Preperitoneal component of the mesh shrunken but repair intact on that.  No obturator hernia.  DESCRIPTION:   The patient was identified & brought into the operating room. The patient was positioned supine with arms tucked. SCDs were active during the entire case. The patient underwent general anesthesia without any difficulty.  The abdomen was prepped and draped in a sterile fashion. The patient's bladder was emptied.  A Surgical Timeout confirmed our plan.  I made a transverse incision through the inferior umbilical fold.  I made a small transverse nick through the anterior rectus fascia contralateral to the inguinal hernia side and placed a 0-vicryl stitch through the fascia.  I placed a Hasson trocar into the preperitoneal plane.  Entry was clean.  We induced carbon dioxide insufflation. Camera inspection revealed no injury.  I used a 10mm angled scope to bluntly free the peritoneum off the infraumbilical anterior abdominal wall.  I created enough of a preperitoneal  pocket to place 5mm ports into the right & left mid-abdomen into this preperitoneal cavity.  I focused attention on the right side since that was the dominant hernia side.   I used blunt & focused sharp dissection to free the peritoneum off the flank and down to the pubic rim.  I freed the anteriolateral bladder wall off the anteriolateral pelvic wall, sparing midline attachments.   I located a swath of peritoneum going into a hernia fascial defect at the internal ring consistent with an indirect inguinal hernia.  I gradually freed the peritoneal hernia sac off safely and reduced it into the preperitoneal space.  I freed the peritoneum off the spermatic vessels & vas deferens.  I freed peritoneum off the retroperitoneum along the psoas muscle.    I checked & assured hemostasis.  Patient had a definite femoral hernia on that side as well.  This patient had an obvious femoral hernia, I decided to make sure there are no hernias on the contralateral side.  I turned attention on the opposite side.  I did dissection in a similar, mirror-image fashion. The patient had a femoral hernia.  Also direct space hernia.  Patient had dense adhesions of peritoneum into the preperitoneal mesh from the prior open inguinal hernia repair.  I made sure that the vas deferens and spermatic vessels were away from that.  Did have to transect peritoneum off that.  Did a high ligation to close the hernia sac and peritoneal peritoneum as well.  I chose 15x15 cm sheets of ultra-lightweight polypropylene mesh (Ultrapro), one for each side.  I cut a single sigmoid-shaped slit ~6cm from a corner of each mesh.  I placed the meshes into the preperitoneal space & laid them as overlapping diamonds such that at the inferior points, a 6x6 cm corner flap rested in the true anterolateral pelvis, covering the obturator & femoral foramina.   I allowed the bladder to fall back and help tuck the corners of the mesh in.  The medial corners overlapped each  other across midline cephalad to the pubic rim.   This provided >2 inch coverage around the hernia.  Because the defects well covered and not particularly large, I did not need tacks to hold the mesh in place  I held the hernia sacs cephalad & evacuated carbon dioxide.  I closed the fascia  With absorbable suture.  I closed the skin using 4-0 monocryl stitch.  Sterile dressings were applied. The patient was extubated & arrived in the PACU in stable condition..  I had discussed postoperative care with the patient in the holding area.   He is no family or friends with him.  He declined for me to call anyone for him.  Instructions are written in the chart.  Ardeth Sportsman, M.D., F.A.C.S. Gastrointestinal and Minimally Invasive Surgery [facility] [address] [place], [place] [address] [phone] Main / Paging

## 2014-12-11 NOTE — Discharge Instructions (Signed)
HERNIA REPAIR: POST OP INSTRUCTIONS ° °1. DIET: Follow a light bland diet the first 24 hours after arrival home, such as soup, liquids, crackers, etc.  Be sure to include lots of fluids daily.  Avoid fast food or heavy meals as your are more likely to get nauseated.  Eat a low fat the next few days after surgery. °2. Take your usually prescribed home medications unless otherwise directed. °3. PAIN CONTROL: °a. Pain is best controlled by a usual combination of three different methods TOGETHER: °i. Ice/Heat °ii. Over the counter pain medication °iii. Prescription pain medication °b. Most patients will experience some swelling and bruising around the hernia(s) such as the bellybutton, groins, or old incisions.  Ice packs or heating pads (30-60 minutes up to 6 times a day) will help. Use ice for the first few days to help decrease swelling and bruising, then switch to heat to help relax tight/sore spots and speed recovery.  Some people prefer to use ice alone, heat alone, alternating between ice & heat.  Experiment to what works for you.  Swelling and bruising can take several weeks to resolve.   °c. It is helpful to take an over-the-counter pain medication regularly for the first few weeks.  Choose one of the following that works best for you: °i. Naproxen (Aleve, etc)  Two 220mg tabs twice a day °ii. Ibuprofen (Advil, etc) Three 200mg tabs four times a day (every meal & bedtime) °iii. Acetaminophen (Tylenol, etc) 325-650mg four times a day (every meal & bedtime) °d. A  prescription for pain medication should be given to you upon discharge.  Take your pain medication as prescribed.  °i. If you are having problems/concerns with the prescription medicine (does not control pain, nausea, vomiting, rash, itching, etc), please call us [phone] to see if we need to switch you to a different pain medicine that will work better for you and/or control your side effect better. °ii. If you need a refill on your pain  medication, please contact your pharmacy.  They will contact our office to request authorization. Prescriptions will not be filled after 5 pm or on week-ends. °4. Avoid getting constipated.  Between the surgery and the pain medications, it is common to experience some constipation.  Increasing fluid intake and taking a fiber supplement (such as Metamucil, Citrucel, FiberCon, MiraLax, etc) 1-2 times a day regularly will usually help prevent this problem from occurring.  A mild laxative (prune juice, Milk of Magnesia, MiraLax, etc) should be taken according to package directions if there are no bowel movements after 48 hours.   °5. Wash / shower every day.  You may shower over the dressings as they are waterproof.   °6. Remove your waterproof bandages 5 days after surgery.  You may leave the incision open to air.  You may replace a dressing/Band-Aid to cover the incision for comfort if you wish.  Continue to shower over incision(s) after the dressing is off. ° ° ° °7. ACTIVITIES as tolerated:   °a. You may resume regular (light) daily activities beginning the next day--such as daily self-care, walking, climbing stairs--gradually increasing activities as tolerated.  If you can walk 30 minutes without difficulty, it is safe to try more intense activity such as jogging, treadmill, bicycling, low-impact aerobics, swimming, etc. °b. Save the most intensive and strenuous activity for last such as sit-ups, heavy lifting, contact sports, etc  Refrain from any heavy lifting or straining until you are off narcotics for pain control.   °  c. DO NOT PUSH THROUGH PAIN.  Let pain be your guide: If it hurts to do something, don't do it.  Pain is your body warning you to avoid that activity for another week until the pain goes down. °d. You may drive when you are no longer taking prescription pain medication, you can comfortably wear a seatbelt, and you can safely maneuver your car and apply brakes. °e. You may have sexual intercourse  when it is comfortable.  °8. FOLLOW UP in our office °a. Please call [facility] at [phone] to set up an appointment to see your surgeon in the office for a follow-up appointment approximately 2-3 weeks after your surgery. °b. Make sure that you call for this appointment the day you arrive home to insure a convenient appointment time. °9.  IF YOU HAVE DISABILITY OR FAMILY LEAVE FORMS, BRING THEM TO THE OFFICE FOR PROCESSING.  DO NOT GIVE THEM TO YOUR DOCTOR. ° °WHEN TO CALL US [phone]: °1. Poor pain control °2. Reactions / problems with new medications (rash/itching, nausea, etc)  °3. Fever over 101.5 F (38.5 C) °4. Inability to urinate °5. Nausea and/or vomiting °6. Worsening swelling or bruising °7. Continued bleeding from incision. °8. Increased pain, redness, or drainage from the incision ° ° The clinic staff is available to answer your questions during regular business hours (8:30am-5pm).  Please don’t hesitate to call and ask to speak to one of our nurses for clinical concerns.  ° If you have a medical emergency, go to the nearest emergency room or call 911. ° A surgeon from [facility] is always on call at the hospitals in [place] ° °[facility] °[address], [address], [place], [place]  [address] ? ° [address] [address], [place], [place]   [address] °MAIN: [phone] ? TOLL FREE: [phone] ? FAX: [phone] °[email or web address] ° °Managing Pain ° °Pain after surgery or related to activity is often due to strain/injury to muscle, tendon, nerves and/or incisions.  This pain is usually short-term and will improve in a few months.  ° °Many people find it helpful to do the following things TOGETHER to help speed the process of healing and to get back to regular activity more quickly: ° °1. Avoid heavy physical activity at first °a. No lifting greater than 20 pounds at first, then increase to lifting as tolerated over the next few weeks °b. Do not “push  through” the pain.  Listen to your body and avoid positions and maneuvers than reproduce the pain.  Wait a few days before trying something more intense °c. Walking is okay as tolerated, but go slowly and stop when getting sore.  If you can walk 30 minutes without stopping or pain, you can try more intense activity (running, jogging, aerobics, cycling, swimming, treadmill, sex, sports, weightlifting, etc ) °d. Remember: If it hurts to do it, then don’t do it! ° °2. Take Anti-inflammatory medication °a. Choose ONE of the following over-the-counter medications: °i.            Acetaminophen 500mg tabs (Tylenol) 1-2 pills with every meal and just before bedtime (avoid if you have liver problems) °ii.            Naproxen 220mg tabs (ex. Aleve) 1-2 pills twice a day (avoid if you have kidney, stomach, IBD, or bleeding problems) °iii. Ibuprofen 200mg tabs (ex. Advil, Motrin) 3-4 pills with every meal and just before bedtime (avoid if you have kidney, stomach, IBD, or bleeding   problems) °b. Take with food/snack around the clock for 1-2 weeks °i. This helps the muscle and nerve tissues become less irritable and calm down faster ° °3. Use a Heating pad or Ice/Cold Pack °a. 4-6 times a day °b. May use warm bath/hottub  or showers ° °4. Try Gentle Massage and/or Stretching  °a. at the area of pain many times a day °b. stop if you feel pain - do not overdo it ° °Try these steps together to help you body heal faster and avoid making things get worse.  Doing just one of these things may not be enough.   ° °If you are not getting better after two weeks or are noticing you are getting worse, contact our office for further advice; we may need to re-evaluate you & see what other things we can do to help. ° °GETTING TO GOOD BOWEL HEALTH. °Irregular bowel habits such as constipation and diarrhea can lead to many problems over time.  Having one soft bowel movement a day is the most important way to prevent further problems.  The  anorectal canal is designed to handle stretching and feces to safely manage our ability to get rid of solid waste (feces, poop, stool) out of our body.  BUT, hard constipated stools can act like ripping concrete bricks and diarrhea can be a burning fire to this very sensitive area of our body, causing inflamed hemorrhoids, anal fissures, increasing risk is perirectal abscesses, abdominal pain/bloating, an making irritable bowel worse.     ° °The goal: ONE SOFT BOWEL MOVEMENT A DAY!  To have soft, regular bowel movements:  °• Drink plenty of fluids, consider 4-6 tall glasses of water a day.   °• Take plenty of fiber.  Fiber is the undigested part of plant food that passes into the colon, acting s “natures broom” to encourage bowel motility and movement.  Fiber can absorb and hold large amounts of water. This results in a larger, bulkier stool, which is soft and easier to pass. Work gradually over several weeks up to 6 servings a day of fiber (25g a day even more if needed) in the form of: °o Vegetables -- Root (potatoes, carrots, turnips), leafy green (lettuce, salad greens, celery, spinach), or cooked high residue (cabbage, broccoli, etc) °o Fruit -- Fresh (unpeeled skin & pulp), Dried (prunes, apricots, cherries, etc ),  or stewed ( applesauce)  °o Whole grain breads, pasta, etc (whole wheat)  °o Bran cereals  °• Bulking Agents -- This type of water-retaining fiber generally is easily obtained each day by one of the following:  °o Psyllium bran -- The psyllium plant is remarkable because its ground seeds can retain so much water. This product is available as Metamucil, Konsyl, Effersyllium, Per Diem Fiber, or the less expensive generic preparation in drug and health food stores. Although labeled a laxative, it really is not a laxative.  °o Methylcellulose -- This is another fiber derived from wood which also retains water. It is available as Citrucel. °o Polyethylene Glycol - and “artificial” fiber commonly called  Miralax or Glycolax.  It is helpful for people with gassy or bloated feelings with regular fiber °o Flax Seed - a less gassy fiber than psyllium °• No reading or other relaxing activity while on the toilet. If bowel movements take longer than 5 minutes, you are too constipated °• AVOID CONSTIPATION.  High fiber and water intake usually takes care of this.  Sometimes a laxative is needed to stimulate more frequent   bowel movements, but  °• Laxatives are not a good long-term solution as it can wear the colon out.  They can help jump-start bowels if constipated, but should be relied on constantly without discussing with your doctor °o Osmotics (Milk of Magnesia, Fleets phosphosoda, Magnesium citrate, MiraLax, GoLytely) are safer than  °o Stimulants (Senokot, Castor Oil, Dulcolax, Ex Lax)    °o Avoid taking laxatives for more than 7 days in a row. °•  IF SEVERELY CONSTIPATED, try a Bowel Retraining Program: °o Do not use laxatives.  °o Eat a diet high in roughage, such as bran cereals and leafy vegetables.  °o Drink six (6) ounces of prune or apricot juice each morning.  °o Eat two (2) large servings of stewed fruit each day.  °o Take one (1) heaping tablespoon of a psyllium-based bulking agent twice a day. Use sugar-free sweetener when possible to avoid excessive calories.  °o Eat a normal breakfast.  °o Set aside 15 minutes after breakfast to sit on the toilet, but do not strain to have a bowel movement.  °o If you do not have a bowel movement by the third day, use an enema and repeat the above steps.  °• Controlling diarrhea °o Switch to liquids and simpler foods for a few days to avoid stressing your intestines further. °o Avoid dairy products (especially milk & ice cream) for a short time.  The intestines often can lose the ability to digest lactose when stressed. °o Avoid foods that cause gassiness or bloating.  Typical foods include beans and other legumes, cabbage, broccoli, and dairy foods.  Every person has  some sensitivity to other foods, so listen to our body and avoid those foods that trigger problems for you. °o Adding fiber (Citrucel, Metamucil, psyllium, Miralax) gradually can help thicken stools by absorbing excess fluid and retrain the intestines to act more normally.  Slowly increase the dose over a few weeks.  Too much fiber too soon can backfire and cause cramping & bloating. °o Probiotics (such as active yogurt, Align, etc) may help repopulate the intestines and colon with normal bacteria and calm down a sensitive digestive tract.  Most studies show it to be of mild help, though, and such products can be costly. °o Medicines: °- Bismuth subsalicylate (ex. Kayopectate, Pepto Bismol) every 30 minutes for up to 6 doses can help control diarrhea.  Avoid if pregnant. °- Loperamide (Immodium) can slow down diarrhea.  Start with two tablets (4mg total) first and then try one tablet every 6 hours.  Avoid if you are having fevers or severe pain.  If you are not better or start feeling worse, stop all medicines and call your doctor for advice °o Call your doctor if you are getting worse or not better.  Sometimes further testing (cultures, endoscopy, X-ray studies, bloodwork, etc) may be needed to help diagnose and treat the cause of the diarrhea. ° °TROUBLESHOOTING IRREGULAR BOWELS °1) Avoid extremes of bowel movements (no bad constipation/diarrhea) °2) Miralax 17gm mixed in 8oz. water or juice-daily. May use BID as needed.  °3) Gas-x,Phazyme, etc. as needed for gas & bloating.  °4) Soft,bland diet. No spicy,greasy,fried foods.  °5) Prilosec over-the-counter as needed  °6) May hold gluten/wheat products from diet to see if symptoms improve.  °7)  May try probiotics (Align, Activa, etc) to help calm the bowels down °7) If symptoms become worse call back immediately. ° °

## 2014-12-12 ENCOUNTER — Encounter (HOSPITAL_COMMUNITY): Payer: Self-pay | Admitting: Surgery

## 2017-10-24 ENCOUNTER — Ambulatory Visit (HOSPITAL_COMMUNITY)
Admission: EM | Admit: 2017-10-24 | Discharge: 2017-10-24 | Disposition: A | Discharge status: Home / Self Care | Payer: 59 | Source: Home / Self Care

## 2017-10-24 ENCOUNTER — Encounter (HOSPITAL_COMMUNITY): Payer: Self-pay | Admitting: Emergency Medicine

## 2017-10-24 ENCOUNTER — Emergency Department (HOSPITAL_COMMUNITY): Payer: 59

## 2017-10-24 ENCOUNTER — Emergency Department (HOSPITAL_COMMUNITY)
Admission: EM | Admit: 2017-10-24 | Discharge: 2017-10-24 | Disposition: A | Discharge status: Home / Self Care | Payer: 59 | Attending: Emergency Medicine | Admitting: Emergency Medicine

## 2017-10-24 ENCOUNTER — Other Ambulatory Visit: Payer: Self-pay

## 2017-10-24 DIAGNOSIS — Z79899 Other long term (current) drug therapy: Secondary | ICD-10-CM | POA: Insufficient documentation

## 2017-10-24 DIAGNOSIS — S4991XA Unspecified injury of right shoulder and upper arm, initial encounter: Secondary | ICD-10-CM | POA: Diagnosis present

## 2017-10-24 DIAGNOSIS — Y9384 Activity, sleeping: Secondary | ICD-10-CM | POA: Insufficient documentation

## 2017-10-24 DIAGNOSIS — Y92009 Unspecified place in unspecified non-institutional (private) residence as the place of occurrence of the external cause: Secondary | ICD-10-CM | POA: Diagnosis not present

## 2017-10-24 DIAGNOSIS — X500XXA Overexertion from strenuous movement or load, initial encounter: Secondary | ICD-10-CM | POA: Insufficient documentation

## 2017-10-24 DIAGNOSIS — S43014A Anterior dislocation of right humerus, initial encounter: Secondary | ICD-10-CM | POA: Diagnosis not present

## 2017-10-24 DIAGNOSIS — S43004A Unspecified dislocation of right shoulder joint, initial encounter: Secondary | ICD-10-CM

## 2017-10-24 DIAGNOSIS — Y999 Unspecified external cause status: Secondary | ICD-10-CM | POA: Diagnosis not present

## 2017-10-24 MED ORDER — LIDOCAINE-EPINEPHRINE 1 %-1:100000 IJ SOLN
20.0000 mL | Freq: Once | INTRAMUSCULAR | Status: AC
  Administered 2017-10-24: 20 mL via INTRADERMAL
  Filled 2017-10-24: qty 20

## 2017-10-24 MED ORDER — PROPOFOL 10 MG/ML IV BOLUS
0.5000 mg/kg | Freq: Once | INTRAVENOUS | Status: AC
  Administered 2017-10-24: 43.6 mg via INTRAVENOUS
  Filled 2017-10-24: qty 20

## 2017-10-24 MED ORDER — ONDANSETRON HCL 4 MG/2ML IJ SOLN
4.0000 mg | Freq: Once | INTRAMUSCULAR | Status: AC
  Administered 2017-10-24: 4 mg via INTRAVENOUS
  Filled 2017-10-24: qty 2

## 2017-10-24 MED ORDER — HYDROMORPHONE HCL 1 MG/ML IJ SOLN
1.0000 mg | Freq: Once | INTRAMUSCULAR | Status: AC
  Administered 2017-10-24: 1 mg via INTRAVENOUS
  Filled 2017-10-24: qty 1

## 2017-10-24 NOTE — ED Notes (Signed)
Pt at baseline upon discharge. Ambulatory with no s/sx distress

## 2017-10-24 NOTE — ED Triage Notes (Signed)
Pt. Stated, I woke up and my shoulder is hurting , I think I dislocated it. I have had surgery to repair it, it use to happen all the time.

## 2017-10-24 NOTE — ED Notes (Signed)
Pt signed consent for procedural sedation as well as to have right shoulder reduced

## 2017-10-24 NOTE — ED Notes (Signed)
Pt ambulated to bathroom x1 assist.  

## 2017-10-24 NOTE — Discharge Instructions (Addendum)
Follow up with your ortho.   Take 4 over the counter ibuprofen tablets 3 times a day or 2 over-the-counter naproxen tablets twice a day for pain. Also take tylenol 1000mg (2 extra strength) four times a day.

## 2017-10-24 NOTE — ED Notes (Signed)
Ortho tech paged  

## 2017-10-24 NOTE — ED Notes (Signed)
PA at bedside updating patient at this time.   

## 2017-10-24 NOTE — ED Notes (Signed)
Patient states he does not have a ride or way home other than to drive.  This RN spoke with PA who states to medicate patient regardless

## 2017-10-24 NOTE — ED Provider Notes (Signed)
[facility] EM[facility]GENCY [facility]MENT Provider Note   CSN: [id number] Arrival date & time: 10/24/17  1011     History   Chief Complaint Chief Complaint  Patient presents with  . Shoulder Pain    HPI Kevin Short is a 57 y.o. male.  57 yo M with a chief complaint of right shoulder pain.  The patient thinks that he dislocated when he rolled over in bed.  Had a history of dislocation in the past had a surgical repair where the glenoid was increased surgically for him.  He does not remember who performed the procedure.  Thinks it was about 15 years ago.  Denies recent trauma.  Denies numbness or tingling.  The history is provided by the patient.  Illness  This is a new problem. The current episode started 1 to 2 hours ago. The problem occurs constantly. The problem has not changed since onset.Pertinent negatives include no chest pain, no abdominal pain, no headaches and no shortness of breath. The symptoms are aggravated by bending and twisting. Nothing relieves the symptoms. He has tried nothing for the symptoms.    Past Medical History:  Diagnosis Date  . Inguinal hernia    RIGHT    There are no active problems to display for this patient.   Past Surgical History:  Procedure Laterality Date  . HERNIA REPAIR  1995   LEFT INGUINAL  . INGUINAL HERNIA REPAIR Bilateral 12/11/2014   Procedure: LAPAROSCOPIC BILATERAL INGUINAL AND BILATERAL FEMEROL  HERNIA  REPAIRS WITH MESH;  Surgeon: Karie SodaSteven Gross, MD;  Location: [facility];  Service: General;  Laterality: Bilateral;  . INSERTION OF MESH Bilateral 12/11/2014   Procedure: INSERTION OF MESH;  Surgeon: Karie SodaSteven Gross, MD;  Location: [facility];  Service: General;  Laterality: Bilateral;  . SHOULDER SURGERY  2005   RT        Home Medications    Prior to Admission medications   Medication Sig Start Date End Date Taking? Authorizing Provider  diphenhydrAMINE (SOMINEX) 25 MG tablet Take 25 mg by mouth at bedtime as needed for  allergies or sleep.    [provider]  Multiple Vitamin (MULTIVITAMIN WITH MINERALS) TABS tablet Take 1 tablet by mouth every morning.    [provider]  naproxen (NAPROSYN) 500 MG tablet Take 1 tablet (500 mg total) by mouth 2 (two) times daily with a meal. 12/11/14   Karie SodaGross, Steven, MD  oxyCODONE (OXY IR/ROXICODONE) 5 MG immediate release tablet Take 1-2 tablets (5-10 mg total) by mouth every 4 (four) hours as needed for moderate pain, severe pain or breakthrough pain. 12/11/14   Karie SodaGross, Steven, MD    Family History No family history on file.  Social History Social History   Tobacco Use  . Smoking status: Never Smoker  . Smokeless tobacco: Never Used  Substance Use Topics  . Alcohol use: Yes    Comment: OCCASIONAL  . Drug use: No     Allergies   Patient has no known allergies.   Review of Systems Review of Systems  Constitutional: Negative for chills and fever.  HENT: Negative for congestion and facial swelling.   Eyes: Negative for discharge and visual disturbance.  Respiratory: Negative for shortness of breath.   Cardiovascular: Negative for chest pain and palpitations.  Gastrointestinal: Negative for abdominal pain, diarrhea and vomiting.  Musculoskeletal: Positive for arthralgias and myalgias.  Skin: Negative for color change and rash.  Neurological: Negative for tremors, syncope and headaches.  Psychiatric/Behavioral: Negative for confusion  and dysphoric mood.     Physical Exam Updated Vital Signs BP 133/89   Pulse 74   Temp 98.7 F (37.1 C) (Oral)   Resp 17   Ht 5\' 11"  (1.803 m)   Wt 87.1 kg (192 lb)   SpO2 94%   BMI 26.78 kg/m   Physical Exam  Constitutional: He appears well-developed and well-nourished.  HENT:  Head: Normocephalic and atraumatic.  Eyes: Pupils are equal, round, and reactive to light. EOM are normal.  Neck: Normal range of motion. Neck supple. No JVD present.  Cardiovascular: Normal rate and regular rhythm. Exam  reveals no gallop and no friction rub.  No murmur heard. Pulmonary/Chest: No respiratory distress. He has no wheezes.  Abdominal: He exhibits no distension. There is no rebound and no guarding.  Musculoskeletal: Normal range of motion. He exhibits tenderness and deformity.  Deformity to the right shoulder.  Neurological: He is alert.  Patient is arousable only to pain actively drooling protecting his airway  Skin: No rash noted. No pallor.  Psychiatric: He has a normal mood and affect. His behavior is normal.  Nursing note and vitals reviewed.    ED Treatments / Results  Labs (all labs ordered are listed, but only abnormal results are displayed) Labs Reviewed - No data to display  EKG None  Radiology Dg Shoulder Right  Result Date: 10/24/2017 CLINICAL DATA:  History of shoulder dislocations, now with shoulder pain. EXAM: RIGHT SHOULDER - 2+ VIEW COMPARISON:  Right shoulder radiographs - 11/07/2003; 05/11/2003 FINDINGS: There is a recurrent anterior inferior dislocation of the glenohumeral joint. Note is again made of focal concavity involving the posterosuperior aspect of the humeral head suggestive of a Hill-Sachs deformity (seen best on the provided scapular Y radiograph). Limited visualization of the adjacent acromioclavicular joint appears normal. No evidence of calcific tendinitis. Limited visualization of the adjacent thorax is normal. 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 Electronically Signed   By: Simonne Come M.D.   On: 10/24/2017 11:06    Procedures .Joint Aspiration/Arthrocentesis Date/Time: 10/24/2017 2:26 PM Performed by: Melene Plan, DO Authorized by: Melene Plan, DO   Consent:    Consent obtained:  Verbal   Consent given by:  Patient   Risks discussed:  Bleeding and infection   Alternatives discussed:  No treatment and delayed treatment Location:    Location:   Shoulder   Shoulder:  R glenohumeral Anesthesia (see MAR for exact dosages):    Anesthesia method:  Local infiltration   Local anesthetic:  Lidocaine 2% WITH epi Procedure details:    Preparation: Patient was prepped and draped in usual sterile fashion     Needle gauge: 21.   Ultrasound guidance: no     Approach:  Lateral   Steroid injected: no     Specimen collected: no   Post-procedure details:    Dressing:  Adhesive bandage   Patient tolerance of procedure:  Tolerated well, no immediate complications Reduction of dislocation Date/Time: 10/24/2017 2:26 PM Performed by: Melene Plan, DO Authorized by: Melene Plan, DO  Consent: Verbal consent obtained. Written consent obtained. Consent given by: patient Patient understanding: patient states understanding of the procedure being performed Patient consent: the patient's understanding of the procedure matches consent given Imaging studies: imaging studies available Required items: required blood products, implants, devices, and special equipment available Patient identity confirmed: verbally with patient Time out: Immediately prior to procedure a "time out" was called to verify the correct patient,  procedure, equipment, support staff and site/side marked as required. Local anesthesia used: yes Anesthesia: local infiltration and see MAR for details  Anesthesia: Local anesthesia used: yes Local Anesthetic: lidocaine 2% with epinephrine Anesthetic total: 10 mL  Sedation: Patient sedated: yes Sedation type: moderate (conscious) sedation Sedatives: propofol Vitals: Vital signs were monitored during sedation.  Patient tolerance: Patient tolerated the procedure well with no immediate complications  .Sedation Date/Time: 10/24/2017 2:27 PM Performed by: Melene Plan, DO Authorized by: Melene Plan, DO   Consent:    Consent obtained:  Written and verbal   Consent given by:  Patient   Risks discussed:  Inadequate sedation, nausea,  vomiting, respiratory compromise necessitating ventilatory assistance and intubation and prolonged sedation necessitating reversal   Alternatives discussed:  Analgesia without sedation Universal protocol:    Immediately prior to procedure a time out was called: yes     Patient identity confirmation method:  Arm band and verbally with patient Indications:    Procedure performed:  Dislocation reduction   Procedure necessitating sedation performed by:  Physician performing sedation   Intended level of sedation:  Moderate (conscious sedation) Pre-sedation assessment:    Time since last food or drink:  Unknown   NPO status caution: unable to specify NPO status     ASA classification: class 1 - normal, healthy patient     Neck mobility: normal     Mouth opening:  2 finger widths   Thyromental distance:  3 finger widths   Mallampati score:  II - soft palate, uvula, fauces visible   Pre-sedation assessments completed and reviewed: airway patency, cardiovascular function, hydration status, mental status, nausea/vomiting, pain level, respiratory function and temperature   Immediate pre-procedure details:    Reviewed: vital signs, relevant labs/tests and NPO status     Verified: bag valve mask available, emergency equipment available, intubation equipment available, IV patency confirmed, oxygen available and suction available   Procedure details (see MAR for exact dosages):    Preoxygenation:  Nasal cannula   Sedation:  Propofol   Analgesia: dilaudid.   Intra-procedure monitoring:  Blood pressure monitoring, cardiac monitor, continuous pulse oximetry, frequent LOC assessments and frequent vital sign checks   Intra-procedure events: none     Total Provider sedation time (minutes):  35 Post-procedure details:    Post-sedation assessments completed and reviewed: airway patency, cardiovascular function, hydration status, mental status, nausea/vomiting, pain level, respiratory function and temperature      Patient is stable for discharge or admission: yes     Patient tolerance:  Tolerated well, no immediate complications   (including critical care time)  Medications Ordered in ED Medications  HYDROmorphone (DILAUDID) injection 1 mg (1 mg Intravenous Given 10/24/17 1138)  ondansetron (ZOFRAN) injection 4 mg (4 mg Intravenous Given 10/24/17 1137)  lidocaine-EPINEPHrine (XYLOCAINE W/EPI) 1 %-1:100000 (with pres) injection 20 mL (20 mLs Intradermal Given by Other 10/24/17 1321)  propofol (DIPRIVAN) 10 mg/mL bolus/IV push 43.6 mg (43.6 mg Intravenous Given by Other 10/24/17 1321)     Initial Impression / Assessment and Plan / ED Course  I have reviewed the triage vital signs and the nursing notes.  Pertinent labs & imaging results that were available during my care of the patient were reviewed by me and considered in my medical decision making (see chart for details).     57 yo M with a chief complaint of a right shoulder dislocation.  Initially history was difficult secondary to the patient receiving pain medicines prior to coming back to the room.  He had a very limited GCS but was protecting his airway.  I was able to wake him up enough to consent him for conscious sedation as I was unable to relocate the shoulder with just a local block. Plain film with anterior R shoulder dislocation as viewed by me.   Relocated with park maneuver without difficulty.  PMS intact post procedure.  D/c home. Ortho follow up.   2:29 PM:  I have discussed the diagnosis/risks/treatment options with the patient and believe the pt to be eligible for discharge home to follow-up with ortho. We also discussed returning to the ED immediately if new or worsening sx occur. We discussed the sx which are most concerning (e.g., sudden worsening pain, fever, inability to tolerate by mouth) that necessitate immediate return. Medications administered to the patient during their visit and any new prescriptions provided to the  patient are listed below.  Medications given during this visit Medications  HYDROmorphone (DILAUDID) injection 1 mg (1 mg Intravenous Given 10/24/17 1138)  ondansetron (ZOFRAN) injection 4 mg (4 mg Intravenous Given 10/24/17 1137)  lidocaine-EPINEPHrine (XYLOCAINE W/EPI) 1 %-1:100000 (with pres) injection 20 mL (20 mLs Intradermal Given by Other 10/24/17 1321)  propofol (DIPRIVAN) 10 mg/mL bolus/IV push 43.6 mg (43.6 mg Intravenous Given by Other 10/24/17 1321)      The patient appears reasonably screen and/or stabilized for discharge and I doubt any other medical condition or other [facility] requiring further screening, evaluation, or treatment in the ED at this time prior to discharge.    Final Clinical Impressions(s) / ED Diagnoses   Final diagnoses:  Shoulder dislocation, right, initial encounter    ED Discharge Orders    None       Melene Plan, DO 10/24/17 1429

## 2017-10-24 NOTE — ED Provider Notes (Signed)
MSE was initiated and I personally evaluated the patient and placed orders (if any) at  11:19 AM on October 24, 2017.  Right shoulder pain, rolled over bed this morning and had pain in the right shoulder, tried to massage the area which he thought was helping however when he got up this morning he noticed his shoulder was likely dislocated again.  Patient reports multiple dislocations previously but none since 2005 when he had surgery to correct this, does not see orthopedics regularly and does not recall who did his surgery in 2005.  Patient ate breakfast (bowl of cereal) at 6 AM today.  On exam right shoulder deformity, neurovascular intact.  The patient appears stable so that the remainder of the MSE may be completed by another provider.   Jeannie FendMurphy, Laura A, PA-C 10/24/17 1120    Cathren LaineSteinl, Kevin, MD 10/24/17 1249

## 2018-04-25 DIAGNOSIS — R9431 Abnormal electrocardiogram [ECG] [EKG]: Secondary | ICD-10-CM | POA: Diagnosis not present

## 2018-04-25 DIAGNOSIS — R404 Transient alteration of awareness: Secondary | ICD-10-CM | POA: Diagnosis not present

## 2018-04-25 DIAGNOSIS — R410 Disorientation, unspecified: Secondary | ICD-10-CM | POA: Diagnosis not present

## 2018-06-22 ENCOUNTER — Other Ambulatory Visit: Payer: Self-pay

## 2018-06-22 ENCOUNTER — Emergency Department (HOSPITAL_COMMUNITY)
Admission: EM | Admit: 2018-06-22 | Discharge: 2018-06-22 | Disposition: A | Discharge status: Home / Self Care | Payer: 59 | Attending: Emergency Medicine | Admitting: Emergency Medicine

## 2018-06-22 ENCOUNTER — Emergency Department (HOSPITAL_COMMUNITY): Payer: 59

## 2018-06-22 ENCOUNTER — Encounter (HOSPITAL_COMMUNITY): Payer: Self-pay

## 2018-06-22 DIAGNOSIS — R000 Tachycardia, unspecified: Secondary | ICD-10-CM | POA: Diagnosis not present

## 2018-06-22 DIAGNOSIS — G40909 Epilepsy, unspecified, not intractable, without status epilepticus: Secondary | ICD-10-CM | POA: Insufficient documentation

## 2018-06-22 DIAGNOSIS — R569 Unspecified convulsions: Secondary | ICD-10-CM | POA: Diagnosis not present

## 2018-06-22 DIAGNOSIS — R4182 Altered mental status, unspecified: Secondary | ICD-10-CM | POA: Diagnosis not present

## 2018-06-22 LAB — COMPREHENSIVE METABOLIC PANEL
ALT: 21 U/L (ref 0–44)
AST: 27 U/L (ref 15–41)
Albumin: 4.9 g/dL (ref 3.5–5.0)
Alkaline Phosphatase: 42 U/L (ref 38–126)
Anion gap: 27 — ABNORMAL HIGH (ref 5–15)
BUN: 11 mg/dL (ref 6–20)
CALCIUM: 9.8 mg/dL (ref 8.9–10.3)
CO2: 13 mmol/L — AB (ref 22–32)
Chloride: 102 mmol/L (ref 98–111)
Creatinine, Ser: 1.22 mg/dL (ref 0.61–1.24)
GFR calc Af Amer: >60 mL/min (ref >60)
GFR calc non Af Amer: >60 mL/min (ref >60)
Glucose, Bld: 131 mg/dL — ABNORMAL HIGH (ref 70–99)
Potassium: 3.6 mmol/L (ref 3.5–5.1)
Sodium: 142 mmol/L (ref 135–145)
Total Bilirubin: 0.8 mg/dL (ref 0.3–1.2)
Total Protein: 7.7 g/dL (ref 6.5–8.1)

## 2018-06-22 LAB — CBC WITH DIFFERENTIAL/PLATELET
Abs Immature Granulocytes: 0.04 10*3/uL (ref 0.00–0.07)
Basophils Absolute: 0.1 10*3/uL (ref 0.0–0.1)
Basophils Relative: 1 %
Eosinophils Absolute: 0.3 10*3/uL (ref 0.0–0.5)
Eosinophils Relative: 4 %
HCT: 50.7 % (ref 39.0–52.0)
Hemoglobin: 16.1 g/dL (ref 13.0–17.0)
Immature Granulocytes: 1 %
Lymphocytes Relative: 36 %
Lymphs Abs: 3.1 10*3/uL (ref 0.7–4.0)
MCH: 32.1 pg (ref 26.0–34.0)
MCHC: 31.8 g/dL (ref 30.0–36.0)
MCV: 101 fL — ABNORMAL HIGH (ref 80.0–100.0)
Monocytes Absolute: 0.8 10*3/uL (ref 0.1–1.0)
Monocytes Relative: 9 %
NEUTROS ABS: 4.2 10*3/uL (ref 1.7–7.7)
NRBC: 0 % (ref 0.0–0.2)
Neutrophils Relative %: 49 %
Platelets: 236 10*3/uL (ref 150–400)
RBC: 5.02 MIL/uL (ref 4.22–5.81)
RDW: 11.5 % (ref 11.5–15.5)
WBC: 8.4 10*3/uL (ref 4.0–10.5)

## 2018-06-22 LAB — CBG MONITORING, ED: Glucose-Capillary: 107 mg/dL — ABNORMAL HIGH (ref 70–99)

## 2018-06-22 LAB — ETHANOL: Alcohol, Ethyl (B): <10 mg/dL (ref <10)

## 2018-06-22 LAB — MAGNESIUM: Magnesium: 2.3 mg/dL (ref 1.7–2.4)

## 2018-06-22 MED ORDER — LEVETIRACETAM IN NACL 1000 MG/100ML IV SOLN
1000.0000 mg | Freq: Once | INTRAVENOUS | Status: AC
  Administered 2018-06-22: 1000 mg via INTRAVENOUS
  Filled 2018-06-22: qty 100

## 2018-06-22 MED ORDER — MAGNESIUM SULFATE 2 GM/50ML IV SOLN: 2.0000 g | INTRAVENOUS | Status: DC

## 2018-06-22 MED ORDER — LEVETIRACETAM 500 MG PO TABS: 500.0000 mg | ORAL_TABLET | Freq: Two times a day (BID) | ORAL | 3 refills | Status: DC

## 2018-06-22 MED ORDER — LORAZEPAM 2 MG/ML IJ SOLN
INTRAMUSCULAR | Status: AC
  Administered 2018-06-22: 1 mg
  Filled 2018-06-22: qty 1

## 2018-06-22 NOTE — ED Triage Notes (Signed)
Pt was seen in our cafeteria by a tech to appear to be having a seizure. Pt was eased to the floor and then proceeded to seize for 90 seconds. No known hx of seizures at this time. Upon arrival to the room pt appears to be postictal, following commands but disoriented. VSS

## 2018-06-22 NOTE — ED Provider Notes (Signed)
[facility]   CSN: [id number] Arrival date & time: 06/22/18  1236    History   Chief Complaint Chief Complaint  Patient presents with   Seizures    HPI Kevin Short is a 58 y.o. male.     HPI  The patient is a 58 year old male, he has an unknown medical history as he is presenting here for the first time by himself after he was found to have a seizure in the cafeteria today.  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  This was witnessed by an emergency department staff member who was standing near him when he fumbled around bounced into the wall and then had tonic-clonic activity lasting approximately 90 seconds.  Since then the patient has improved, as he is not seizing any longer however he is not able to answer questions.  Level 5 caveat applies.  History reviewed. No pertinent past medical history.  There are no active problems to display for this patient.   History reviewed. No pertinent surgical history.      Home Medications    Prior to Admission medications   Medication Sig Start Date End Date Taking? Authorizing Provider  levETIRAcetam (KEPPRA) 500 MG tablet Take 1 tablet (500 mg total) by mouth 2 (two) times daily for 30 days. 06/22/18 07/22/18  Eber HongMiller, Donnielle Addison, MD    Family History History reviewed. No pertinent family history.  Social History Social History   Tobacco Use   Smoking status: Unknown If Ever Smoked  Substance Use Topics   Alcohol use: Never    Frequency: Never   Drug use: Never     Allergies   Patient has no known allergies.   Review of Systems Review of Systems  Unable to perform ROS: Mental status change     Physical Exam Updated Vital Signs BP 126/81    Pulse 77    Temp 98.6 F (37 C) (Oral)    Resp 14    Ht 1.778 m (5\' 10" )    Wt 90.7 kg    SpO2 99%    BMI 28.70  kg/m   Physical Exam Vitals signs and nursing note reviewed.  Constitutional:      General: He is not in acute distress.    Appearance: He is well-developed.  HENT:     Head: Normocephalic and atraumatic.     Mouth/Throat:     Pharynx: No oropharyngeal exudate.  Eyes:     General: No scleral icterus.       Right eye: No discharge.        Left eye: No discharge.     Conjunctiva/sclera: Conjunctivae normal.     Pupils: Pupils are equal, round, and reactive to light.  Neck:     Musculoskeletal: Normal range of motion and neck supple.     Thyroid: No thyromegaly.     Vascular: No JVD.  Cardiovascular:     Rate and Rhythm: Regular rhythm. Tachycardia present.     Heart sounds: Normal heart sounds. No murmur. No friction rub. No gallop.      Comments: Heart rate of 103 Pulmonary:     Effort: Pulmonary effort is normal. No respiratory distress.     Breath sounds: Normal breath sounds. No wheezing or rales.  Abdominal:     General: Bowel sounds are normal. There is no distension.  Palpations: Abdomen is soft. There is no mass.     Tenderness: There is no abdominal tenderness.  Musculoskeletal: Normal range of motion.        General: No tenderness.  Lymphadenopathy:     Cervical: No cervical adenopathy.  Skin:    General: Skin is warm and dry.     Findings: No erythema or rash.  Neurological:     Mental Status: He is alert.     Coordination: Coordination normal.     Comments: Pupils are approximately 3 mm and reactive bilaterally, he has normal extraocular movements, he can follow commands but has difficulty processing the questions.  His speech seems clear and there is no facial droop.  He has 5 out of 5 strength in all 4 extremities.  He is postictal and confused  Psychiatric:        Behavior: Behavior normal.      ED Treatments / Results  Labs (all labs ordered are listed, but only abnormal results are displayed) Labs Reviewed  CBC WITH DIFFERENTIAL/PLATELET -  Abnormal; Notable for the following components:      Result Value   MCV 101.0 (*)    All other components within normal limits  COMPREHENSIVE METABOLIC PANEL - Abnormal; Notable for the following components:   CO2 13 (*)    Glucose, Bld 131 (*)    Anion gap 27 (*)    All other components within normal limits  CBG MONITORING, ED - Abnormal; Notable for the following components:   Glucose-Capillary 107 (*)    All other components within normal limits  ETHANOL  MAGNESIUM  RAPID URINE DRUG SCREEN, HOSP PERFORMED    EKG EKG Interpretation  Date/Time:  Friday June 22 2018 12:40:54 EDT Ventricular Rate:  100 PR Interval:    QRS Duration: 81 QT Interval:  336 QTC Calculation: 434 R Axis:   53 Text Interpretation:  Sinus tachycardia No old tracing to compare Confirmed by Eber Hong ([id number]) on 06/22/2018 1:18:35 PM   Radiology Ct Head Wo Contrast  Result Date: 06/22/2018 CLINICAL DATA:  Altered level of consciousness.  Possible seizure. EXAM: CT HEAD WITHOUT CONTRAST TECHNIQUE: Contiguous axial images were obtained from the base of the skull through the vertex without intravenous contrast. COMPARISON:  None. FINDINGS: Brain: No evidence of acute infarction, hemorrhage, hydrocephalus, extra-axial collection or mass lesion/mass effect. Vascular: Negative for hyperdense vessel Skull: Negative Sinuses/Orbits: Mild mucosal edema right maxillary sinus otherwise clear. Negative orbit Other: None IMPRESSION: Negative CT head Electronically Signed   By: Marlan Palau M.D.   On: 06/22/2018 13:20    Procedures Procedures (including critical care time)  Medications Ordered in ED Medications  LORazepam (ATIVAN) 2 MG/ML injection (1 mg  Given 06/22/18 1246)  levETIRAcetam (KEPPRA) IVPB 1000 mg/100 mL premix (1,000 mg Intravenous New Bag/Given 06/22/18 1451)     Initial Impression / Assessment and Plan / ED Course  I have reviewed the triage vital signs and the nursing notes.  Pertinent  labs & imaging results that were available during my care of the patient were reviewed by me and considered in my medical decision making (see chart for details).    The patient appears to have a new onset seizure, we have no other information, family is not available at the time, he will go to CT scan of the brain, labs ordered, CBG 82.  Ativan given and seizure precautions taken.  Patient reexamined at 1:11 PM at which time he is more awake and alert and able  to tell me that he has had episodes in the past where he has had some seizure-like activity and despite having a CT scan and an MRI many years ago he has never been diagnosed with anything specific.  He states this happens every couple of years.  He does endorse having a couple of drinks every night when he gets home from work but denies that he is stop drinking and his last drink was last night.  He was feeling fine when he woke up this morning but upon arriving to work he started to feel "fuzzy headed", when he was in the cafeteria it became worse and that is the last thing that he remembers.  He denies headaches chest pain shortness of breath palpitations or focal neurologic symptoms.  At this time he has a normal neurologic exam and is back to his normal baseline.  Gust with neurology, they recommend outpatient follow-up with neurology, no driving, load with Keppra and home with same.  Patient informed, stable for discharge, expressed understanding regarding rules of no driving.  He also stated that he was likely not to stop driving because that was the source of his livelihood getting back and forth to work.  I reiterated what the law was, the patient expressed his understanding.  Final Clinical Impressions(s) / ED Diagnoses   Final diagnoses:  Seizure [facility])    ED Discharge Orders         Ordered    levETIRAcetam (KEPPRA) 500 MG tablet  2 times daily     06/22/18 1509           Eber Hong, MD 06/22/18 1512

## 2018-06-22 NOTE — ED Notes (Signed)
Pt verbalized understanding of d/c instructions and has no further questions. 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 Pt to follow up with neuro and pcp. VSS. NAD. Pt d/c to the waiting room to wait for his ride.

## 2018-06-22 NOTE — ED Notes (Signed)
CBG collected. Result "107." RN, Lenora Boys., notified.

## 2018-06-22 NOTE — Discharge Instructions (Addendum)
Please call the office of the neurologist as listed above to make an appointment for a follow-up.  You should be seen at the next available follow-up.  Your testing today revealed a normal-appearing brain on the CT scan as well as normal-appearing blood work.  Please be aware that it is illegal for you to drive a vehicle until you are cleared by a neurologist.  Please take Keppra, 500 mg by mouth twice a day until you follow-up with a neurologist.  I have provided you with a 1 month prescription and 3 refills.  If you have recurrent seizures despite taking this medication you should return to the emergency department immediately.

## 2018-06-22 NOTE — ED Notes (Signed)
Pt taken to CT by Trish Mage

## 2018-06-25 ENCOUNTER — Encounter (HOSPITAL_COMMUNITY): Payer: Self-pay | Admitting: Emergency Medicine

## 2018-07-16 MED FILL — levETIRAcetam 500 MG TABS: 500 | 30 days supply | Qty: 60 | Fill #0

## 2018-08-16 MED FILL — levETIRAcetam 500 MG TABS: 500 | 30 days supply | Qty: 60 | Fill #1

## 2018-09-03 ENCOUNTER — Telehealth: Payer: Self-pay | Admitting: Neurology

## 2018-09-03 NOTE — Telephone Encounter (Signed)
Due to current COVID 19 pandemic, our office is severely reducing in office visits until further notice, in order to minimize the risk to our patients and healthcare providers.  °  °Called patient and confirmed a virtual visit for his 6/4 appointment. Patient verbalized understanding of the [email or web address] process and I have sent him an e-mail with link and directions as well as my name and office number/hours for reference. Patient understands that he will receive a call from RN to update chart. °  °Pt understands that although there may be some limitations with this type of visit, we will take all precautions to reduce any security or privacy concerns.  Pt understands that this will be treated like an in office visit and we will file with pt's insurance, and there may be a patient responsible charge related to this service. °  °

## 2018-09-05 ENCOUNTER — Encounter: Payer: Self-pay | Admitting: Neurology

## 2018-09-05 NOTE — Addendum Note (Signed)
Addended by: Ann Maki T on: 09/05/2018 03:22 PM   Modules accepted: Orders

## 2018-09-05 NOTE — Telephone Encounter (Signed)
I contacted the pt and updated EMR for 09/06/18 visit.  Pt confirmed link has been received. Pt is being evaluated for seizures, referred by ED.

## 2018-09-06 ENCOUNTER — Ambulatory Visit (INDEPENDENT_AMBULATORY_CARE_PROVIDER_SITE_OTHER): Payer: 59 | Admitting: Neurology

## 2018-09-06 ENCOUNTER — Encounter: Payer: Self-pay | Admitting: Neurology

## 2018-09-06 ENCOUNTER — Other Ambulatory Visit: Payer: Self-pay

## 2018-09-06 DIAGNOSIS — G40909 Epilepsy, unspecified, not intractable, without status epilepticus: Secondary | ICD-10-CM

## 2018-09-06 MED ORDER — LEVETIRACETAM 500 MG PO TABS: 500.0000 mg | ORAL_TABLET | Freq: Two times a day (BID) | ORAL | 1 refills | Status: DC

## 2018-09-06 NOTE — Progress Notes (Signed)
Virtual Visit via Video Note  I connected with Kevin Short on 09/06/18 at  3:00 PM EDT by a video enabled telemedicine application and verified that I am speaking with the correct person using two identifiers.  Location: Patient: The patient is at home. Provider: The physician is in office.   I discussed the limitations of evaluation and management by telemedicine and the availability of in person appointments. The patient expressed understanding and agreed to proceed.  History of Present Illness: Kevin Short is a 58 year old right-handed white male with a history of primarily nocturnal seizure since he was around 58 years old.  The patient indicates that he generally will have 2 or 3 seizure episodes a year, he apparently has never been placed on any anticonvulsant medications until just recently.  All of his seizures have been at night while sleeping, and they are generally brought on by sleep deprivation or stress.  The patient will wake up having bitten his tongue and have soreness in the calf muscles of the legs.  The patient had his first daytime seizure on 22 June 2018.  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  He was going down to get lunch at the cafeteria, he then suddenly felt dizzy and flushed.  The patient apparently bumped against the wall and then started having a generalized tonic-clonic seizure.  The patient was taken to the emergency room, a CT scan of the brain was done and was normal.  A urine drug screen was not done.  The patient was placed on Keppra 500 mg twice daily.  He has been sent to this office for further evaluation.  The patient denies any headaches or dizziness, or numbness or weakness of the extremities.  He denies issues controlling the bowels or the bladder or difficulty with balance.  He denies any family history of seizures.  He denies any prior head trauma or concussion.  He is tolerating the Keppra well currently.  The last  seizure prior to the 1 in March 2020 was in October 2019.  The patient went from 2014-2019 without any seizures.   Observations/Objective: The video evaluation reveals that the patient is alert and cooperative.  Speech is well enunciated, not aphasic or dysarthric.  He has a symmetric face, he is able to protrude the tongue in the midline with good lateral movement of the tongue.  Extraocular movements are full.  He has good finger-nose-finger and heel-to-shin bilaterally.  Gait is normal.  Tandem gait is normal.  Romberg is negative.  Assessment and Plan: 1.  History of seizure disorder with recent recurrence  The patient has mainly nocturnal seizures but he has had a recent seizure during the daytime.  I have indicated he is not operate a motor vehicle until around 23 December 2018.  He is to remain on Keppra, I will send in a prescription.  The patient will have an EEG study.  He will follow-up in 4 months.  Follow Up Instructions: 52-month follow-up, may see nurse practitioner.   I discussed the assessment and treatment plan with the patient. The patient was provided an opportunity to ask questions and all were answered. The patient agreed with the plan and demonstrated an understanding of the instructions.   The patient was advised to call back or seek an in-person evaluation if the symptoms worsen or if the condition fails to improve as anticipated.  I provided 30 minutes of non-face-to-face time during this  encounter.   Kathrynn Ducking, MD

## 2018-09-11 ENCOUNTER — Telehealth: Payer: Self-pay | Admitting: Neurology

## 2018-09-11 NOTE — Telephone Encounter (Signed)
Called patient and LVM to schedule a 4 month follow-up with NP per Dr. Jannifer Franklin. Patient also needs EEG scheduled. I gave my direct line for reference.

## 2018-09-12 NOTE — Telephone Encounter (Signed)
Pt. Called and scheduled both appts.

## 2018-09-12 NOTE — Telephone Encounter (Signed)
Thank you :)

## 2018-09-14 MED FILL — levETIRAcetam 500 MG TABS: 500 | 90 days supply | Qty: 180 | Fill #0

## 2018-09-26 ENCOUNTER — Telehealth: Payer: Self-pay | Admitting: Neurology

## 2018-09-26 ENCOUNTER — Other Ambulatory Visit: Payer: Self-pay

## 2018-09-26 ENCOUNTER — Ambulatory Visit: Payer: 59 | Admitting: Neurology

## 2018-09-26 DIAGNOSIS — G40909 Epilepsy, unspecified, not intractable, without status epilepticus: Secondary | ICD-10-CM | POA: Diagnosis not present

## 2018-09-26 NOTE — Telephone Encounter (Signed)
I called the patient.  EEG study was unremarkable.  The patient will stay on Keppra, he will let me know if any further events are noted.

## 2018-09-26 NOTE — Procedures (Signed)
    History:  Kevin Short is a 58 year old gentleman with a history of nocturnal seizures since age 83.  The patient had his first daytime seizure on 22 June 2018.  He felt dizzy and flushed and then bumped against the wall and had a generalized tonic-clonic seizure.  The patient is being evaluated for this issue.  This is a routine EEG.  No skull defects are noted.  Medications include Keppra and multivitamins.  EEG classification: Normal awake  Description of the recording: The background rhythms of this recording consists of a fairly well modulated medium amplitude alpha rhythm of 9 Hz that is reactive to eye opening and closure. As the record progresses, the patient appears to remain in the waking state throughout the recording. Photic stimulation was performed, resulting in a bilateral and symmetric photic driving response. Hyperventilation was also performed, resulting in a minimal buildup of the background rhythm activities without significant slowing seen. At no time during the recording does there appear to be evidence of spike or spike wave discharges or evidence of focal slowing. EKG monitor shows no evidence of cardiac rhythm abnormalities with a heart rate of 66.  Impression: This is a normal EEG recording in the waking state. No evidence of ictal or interictal discharges are seen.

## 2018-12-11 MED FILL — levETIRAcetam 500 MG TABS: 500 | 90 days supply | Qty: 180 | Fill #1

## 2019-01-15 NOTE — Progress Notes (Signed)
PATIENT: Kevin Short DOB: May 01, 1960  REASON FOR VISIT: follow up HISTORY FROM: patient  HISTORY OF PRESENT ILLNESS: Today 01/16/19  Kevin Short is a 58 year old male with history of primarily nocturnal seizures since he was 59 years old.  On average he may have 2 or 3 seizures a year.  He went many years without being on anticonvulsant medications.  All of his seizures have been while sleeping, brought on by sleep deprivation or stress.  He had his first daytime seizure on June 22, 2018.  He had a witnessed generalized tonic-clonic seizure at that time.  He was taken to the emergency room and placed on Keppra.  He had an EEG study in June 2020 that was unremarkable.  Since last seen, he has not had recurrent seizure.  He remains on Keppra and is tolerating well.  He says he would know if he had a nocturnal seizure, because he would have stiffness in his calves and soreness to his tongue.  He has resumed operating a motor vehicle.  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  He presents today for follow-up unaccompanied.  HISTORY 09/06/2018 Dr. Jannifer Franklin: Kevin Short is a 58 year old right-handed white male with a history of primarily nocturnal seizure since he was around 58 years old.  The patient indicates that he generally will have 2 or 3 seizure episodes a year, he apparently has never been placed on any anticonvulsant medications until just recently.  All of his seizures have been at night while sleeping, and they are generally brought on by sleep deprivation or stress.  The patient will wake up having bitten his tongue and have soreness in the calf muscles of the legs.  The patient had his first daytime seizure on 22 June 2018.  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  He was going down to get lunch at the cafeteria, he then suddenly felt dizzy and flushed.  The patient apparently bumped against the wall and then started having a generalized  tonic-clonic seizure.  The patient was taken to the emergency room, a CT scan of the brain was done and was normal.  A urine drug screen was not done.  The patient was placed on Keppra 500 mg twice daily.  He has been sent to this office for further evaluation.  The patient denies any headaches or dizziness, or numbness or weakness of the extremities.  He denies issues controlling the bowels or the bladder or difficulty with balance.  He denies any family history of seizures.  He denies any prior head trauma or concussion.  He is tolerating the Keppra well currently.  The last seizure prior to the 1 in March 2020 was in October 2019.  The patient went from 2014-2019 without any seizures.  REVIEW OF SYSTEMS: Out of a complete 14 system review of symptoms, the patient complains only of the following symptoms, and all other reviewed systems are negative.  Seizures  ALLERGIES: No Known Allergies  HOME MEDICATIONS: Outpatient Medications Prior to Visit  Medication Sig Dispense Refill  . Multiple Vitamin (MULTIVITAMIN WITH MINERALS) TABS tablet Take 1 tablet by mouth every morning.    . levETIRAcetam (KEPPRA) 500 MG tablet Take 1 tablet (500 mg total) by mouth 2 (two) times daily. 180 tablet 1   No facility-administered medications prior to visit.     PAST MEDICAL HISTORY: Past Medical History:  Diagnosis Date  . Inguinal hernia  RIGHT  . Seizures (HCC)     PAST SURGICAL HISTORY: Past Surgical History:  Procedure Laterality Date  . HERNIA REPAIR  1995   LEFT INGUINAL  . INGUINAL HERNIA REPAIR Bilateral 12/11/2014   Procedure: LAPAROSCOPIC BILATERAL INGUINAL AND BILATERAL FEMEROL  HERNIA  REPAIRS WITH MESH;  Surgeon: Karie Soda, MD;  Location: [facility];  Service: General;  Laterality: Bilateral;  . INSERTION OF MESH Bilateral 12/11/2014   Procedure: INSERTION OF MESH;  Surgeon: Karie Soda, MD;  Location: [facility];  Service: General;  Laterality: Bilateral;  . SHOULDER SURGERY  2005   RT     FAMILY HISTORY: Family History  Problem Relation Age of Onset  . Heart attack Mother   . Heart attack Father   . Healthy Sister   . Healthy Sister   . Healthy Sister     SOCIAL HISTORY: Social History   Socioeconomic History  . Marital status: Single    Spouse name: Not on file  . Number of children: Not on file  . Years of education: Not on file  . Highest education level: Not on file  Occupational History  . Not on file  Social Needs  . Financial resource strain: Not on file  . Food insecurity    Worry: Not on file    Inability: Not on file  . Transportation needs    Medical: Not on file    Non-medical: Not on file  Tobacco Use  . Smoking status: Never Smoker  . Smokeless tobacco: Never Used  Substance and Sexual Activity  . Alcohol use: Yes    Alcohol/week: 3.0 standard drinks    Types: 3 Cans of beer per week    Frequency: Never    Comment: OCCASIONAL  . Drug use: Never  . Sexual activity: Not on file  Lifestyle  . Physical activity    Days per week: Not on file    Minutes per session: Not on file  . Stress: Not on file  Relationships  . Social Musician on phone: Not on file    Gets together: Not on file    Attends religious service: Not on file    Active member of club or organization: Not on file    Attends meetings of clubs or organizations: Not on file    Relationship status: Not on file  . Intimate partner violence    Fear of current or ex partner: Not on file    Emotionally abused: Not on file    Physically abused: Not on file    Forced sexual activity: Not on file  Other Topics Concern  . Not on file  Social History Narrative   ** Merged History Encounter **       PHYSICAL EXAM  Vitals:   01/16/19 1254  BP: 139/85  Pulse: (!) 57  Temp: 97.9 F (36.6 C)  TempSrc: Oral  Weight: 196 lb 6.4 oz (89.1 kg)  Height: 5\' 10"  (1.778 m)   Body mass index is 28.18 kg/m.  Generalized: Well developed, in no acute distress    Neurological examination  Mentation: Alert oriented to time, place, history taking. Follows all commands speech and language fluent Cranial nerve II-XII: Pupils were equal round reactive to light. Extraocular movements were full, visual field were full on confrontational test. Facial sensation and strength were normal. Head turning and shoulder shrug  were normal and symmetric. Motor: The motor testing reveals 5 over 5 strength of all 4 extremities. Good symmetric  motor tone is noted throughout.  Sensory: Sensory testing is intact to soft touch on all 4 extremities. No evidence of extinction is noted.  Coordination: Cerebellar testing reveals good finger-nose-finger and heel-to-shin bilaterally.  Gait and station: Gait is normal. Tandem gait is normal. Romberg is negative. No drift is seen.  Reflexes: Deep tendon reflexes are symmetric and normal bilaterally.   DIAGNOSTIC DATA (LABS, IMAGING, TESTING) - I reviewed patient records, labs, notes, testing and imaging myself where available.  Lab Results  Component Value Date   WBC 8.4 06/22/2018   HGB 16.1 06/22/2018   HCT 50.7 06/22/2018   MCV 101.0 (H) 06/22/2018   PLT 236 06/22/2018      Component Value Date/Time   NA 142 06/22/2018 1255   K 3.6 06/22/2018 1255   CL 102 06/22/2018 1255   CO2 13 (L) 06/22/2018 1255   GLUCOSE 131 (H) 06/22/2018 1255   BUN 11 06/22/2018 1255   CREATININE 1.22 06/22/2018 1255   CALCIUM 9.8 06/22/2018 1255   PROT 7.7 06/22/2018 1255   ALBUMIN 4.9 06/22/2018 1255   AST 27 06/22/2018 1255   ALT 21 06/22/2018 1255   ALKPHOS 42 06/22/2018 1255   BILITOT 0.8 06/22/2018 1255   GFRNONAA >60 06/22/2018 1255   GFRAA >60 06/22/2018 1255   No results found for: CHOL, HDL, LDLCALC, LDLDIRECT, TRIG, CHOLHDL No results found for: [id number] No results found for: VITAMINB12 No results found for: TSH   ASSESSMENT AND PLAN 58 y.o. year old male  has a past medical history of Inguinal hernia and Seizures (HCC).  here with:  1.  Seizures, most recent seizure June 22, 2018  He has continued to do well since last seen.  He will continue taking Keppra 500 mg twice a day.  He has history of nocturnal seizures, his first daytime seizure occurred in March 2020.  He has not had recurrent seizure.  He will follow-up in 6 months or sooner if needed.  I did advise if symptoms worsen or if he develops any new symptoms he should let us know.  I spent 15 minutes with the patient. 50% of this time was spent discussing his plan of care.  Margie EgeSarah Slack, AGNP-C, DNP 01/16/2019, 1:18 PM [facility] [address], [address] [place], [place] [id number] [phone]

## 2019-01-16 ENCOUNTER — Encounter: Payer: Self-pay | Admitting: Neurology

## 2019-01-16 ENCOUNTER — Ambulatory Visit: Payer: 59 | Admitting: Neurology

## 2019-01-16 ENCOUNTER — Other Ambulatory Visit: Payer: Self-pay

## 2019-01-16 DIAGNOSIS — R569 Unspecified convulsions: Secondary | ICD-10-CM

## 2019-01-16 MED ORDER — LEVETIRACETAM 500 MG PO TABS: 500.0000 mg | ORAL_TABLET | Freq: Two times a day (BID) | ORAL | 1 refills | Status: DC

## 2019-01-16 NOTE — Patient Instructions (Signed)
1. Continue taking Keppra  2. Return in 6 months

## 2019-01-16 NOTE — Progress Notes (Signed)
I have read the note, and I agree with the clinical assessment and plan.  Sapir Lavey K Dalores Weger   

## 2019-03-11 MED FILL — levETIRAcetam 500 MG TABS: 500 | 90 days supply | Qty: 180 | Fill #0

## 2019-06-12 MED FILL — levETIRAcetam 500 MG TABS: 500 | 90 days supply | Qty: 180 | Fill #1

## 2019-07-18 ENCOUNTER — Ambulatory Visit: Payer: 59 | Admitting: Neurology

## 2019-07-18 ENCOUNTER — Other Ambulatory Visit (HOSPITAL_COMMUNITY): Payer: Self-pay | Admitting: Neurology

## 2019-07-18 ENCOUNTER — Encounter: Payer: Self-pay | Admitting: Neurology

## 2019-07-18 ENCOUNTER — Other Ambulatory Visit: Payer: Self-pay

## 2019-07-18 VITALS — BP 139/95 | HR 64 | Temp 98.1°F | Ht 71.0 in | Wt 203.2 lb

## 2019-07-18 DIAGNOSIS — R569 Unspecified convulsions: Secondary | ICD-10-CM | POA: Diagnosis not present

## 2019-07-18 MED ORDER — LEVETIRACETAM 500 MG PO TABS: 500.0000 mg | ORAL_TABLET | Freq: Two times a day (BID) | ORAL | 4 refills | Status: DC

## 2019-07-18 NOTE — Progress Notes (Signed)
I have read the note, and I agree with the clinical assessment and plan.  Parnika Tweten K Leylany Nored   

## 2019-07-18 NOTE — Patient Instructions (Signed)
It was nice to see you today! Continue Keppra, call for seizure See you back in 1 year. Congratulations on your upcoming wedding!

## 2019-07-18 NOTE — Progress Notes (Signed)
PATIENT: Kevin Short DOB: 1960/10/18  REASON FOR VISIT: follow up HISTORY FROM: patient  HISTORY OF PRESENT ILLNESS: Today 07/18/19 Kevin Short is a 59 year old male with history of primarily nocturnal seizures since he was 59 years old.  On average he may have 2 or 3 seizures a year.  He went many years without being on anticonvulsant medications.  All of his seizures have been while sleeping, brought on by sleep deprivation or stress.  On June 22, 2018, he had his first daytime seizure, that was a tonic-clonic seizure.  He remains on Keppra.  He is tolerating without side effect.  He has not had recurrent seizure.  He will know if he had a nocturnal seizure, the next morning his calves will be sore and his tongue thawed.  He had Covid in February.  He is planning to get married in June.  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  He presents today for follow-up unaccompanied.  HISTORY  01/16/2019 SS: Kevin Short is a 59 year old male with history of primarily nocturnal seizures since he was 59 years old.  On average he may have 2 or 3 seizures a year.  He went many years without being on anticonvulsant medications.  All of his seizures have been while sleeping, brought on by sleep deprivation or stress.  He had his first daytime seizure on June 22, 2018.  He had a witnessed generalized tonic-clonic seizure at that time.  He was taken to the emergency room and placed on Keppra.  He had an EEG study in June 2020 that was unremarkable.  Since last seen, he has not had recurrent seizure.  He remains on Keppra and is tolerating well.  He says he would know if he had a nocturnal seizure, because he would have stiffness in his calves and soreness to his tongue.  He has resumed operating a motor vehicle.  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  He presents today for follow-up unaccompanied.  REVIEW OF SYSTEMS: Out of a complete 14 system  review of symptoms, the patient complains only of the following symptoms, and all other reviewed systems are negative.  Seizure  ALLERGIES: No Known Allergies  HOME MEDICATIONS: Outpatient Medications Prior to Visit  Medication Sig Dispense Refill  . Multiple Vitamin (MULTIVITAMIN WITH MINERALS) TABS tablet Take 1 tablet by mouth every morning.    . levETIRAcetam (KEPPRA) 500 MG tablet Take 1 tablet (500 mg total) by mouth 2 (two) times daily. 180 tablet 1   No facility-administered medications prior to visit.    PAST MEDICAL HISTORY: Past Medical History:  Diagnosis Date  . Inguinal hernia    RIGHT  . Seizures (HCC)     PAST SURGICAL HISTORY: Past Surgical History:  Procedure Laterality Date  . HERNIA REPAIR  1995   LEFT INGUINAL  . INGUINAL HERNIA REPAIR Bilateral 12/11/2014   Procedure: LAPAROSCOPIC BILATERAL INGUINAL AND BILATERAL FEMEROL  HERNIA  REPAIRS WITH MESH;  Surgeon: Karie Soda, MD;  Location: [facility];  Service: General;  Laterality: Bilateral;  . INSERTION OF MESH Bilateral 12/11/2014   Procedure: INSERTION OF MESH;  Surgeon: Karie Soda, MD;  Location: [facility];  Service: General;  Laterality: Bilateral;  . SHOULDER SURGERY  2005   RT    FAMILY HISTORY: Family History  Problem Relation Age of Onset  . Heart attack Mother   . Heart attack Father   . Healthy Sister   .  Healthy Sister   . Healthy Sister     SOCIAL HISTORY: Social History   Socioeconomic History  . Marital status: Single    Spouse name: Not on file  . Number of children: Not on file  . Years of education: Not on file  . Highest education level: Not on file  Occupational History  . Not on file  Tobacco Use  . Smoking status: Never Smoker  . Smokeless tobacco: Never Used  Substance and Sexual Activity  . Alcohol use: Yes    Alcohol/week: 3.0 standard drinks    Types: 3 Cans of beer per week    Comment: OCCASIONAL  . Drug use: Never  . Sexual activity: Not on file  Other Topics  Concern  . Not on file  Social History Narrative   ** Merged History Encounter **       Social Determinants of Health   Financial Resource Strain:   . Difficulty of Paying Living Expenses:   Food Insecurity:   . Worried About Programme researcher, broadcasting/film/video in the Last Year:   . Barista in the Last Year:   Transportation Needs:   . Freight forwarder (Medical):   Marland Kitchen Lack of Transportation (Non-Medical):   Physical Activity:   . Days of Exercise per Week:   . Minutes of Exercise per Session:   Stress:   . Feeling of Stress :   Social Connections:   . Frequency of Communication with Friends and Family:   . Frequency of Social Gatherings with Friends and Family:   . Attends Religious Services:   . Active Member of Clubs or Organizations:   . Attends Banker Meetings:   Marland Kitchen Marital Status:   Intimate Partner Violence:   . Fear of Current or Ex-Partner:   . Emotionally Abused:   Marland Kitchen Physically Abused:   . Sexually Abused:    PHYSICAL EXAM  Vitals:   07/18/19 1253  BP: (!) 139/95  Pulse: 64  Temp: 98.1 F (36.7 C)  Weight: 203 lb 3.2 oz (92.2 kg)  Height: 5\' 11"  (1.803 m)   Body mass index is 28.34 kg/m.  Generalized: Well developed, in no acute distress   Neurological examination  Mentation: Alert oriented to time, place, history taking. Follows all commands speech and language fluent Cranial nerve II-XII: Pupils were equal round reactive to light. Extraocular movements were full, visual field were full on confrontational test. Facial sensation and strength were normal. Head turning and shoulder shrug  were normal and symmetric. Motor: The motor testing reveals 5 over 5 strength of all 4 extremities. Good symmetric motor tone is noted throughout.  Sensory: Sensory testing is intact to soft touch on all 4 extremities. No evidence of extinction is noted.  Coordination: Cerebellar testing reveals good finger-nose-finger and heel-to-shin bilaterally.  Gait and  station: Gait is normal. Tandem gait is normal. Romberg is negative. No drift is seen.  Reflexes: Deep tendon reflexes are symmetric and normal bilaterally.   DIAGNOSTIC DATA (LABS, IMAGING, TESTING) - I reviewed patient records, labs, notes, testing and imaging myself where available.  Lab Results  Component Value Date   WBC 8.4 06/22/2018   HGB 16.1 06/22/2018   HCT 50.7 06/22/2018   MCV 101.0 (H) 06/22/2018   PLT 236 06/22/2018      Component Value Date/Time   NA 142 06/22/2018 1255   K 3.6 06/22/2018 1255   CL 102 06/22/2018 1255   CO2 13 (L) 06/22/2018 1255  GLUCOSE 131 (H) 06/22/2018 1255   BUN 11 06/22/2018 1255   CREATININE 1.22 06/22/2018 1255   CALCIUM 9.8 06/22/2018 1255   PROT 7.7 06/22/2018 1255   ALBUMIN 4.9 06/22/2018 1255   AST 27 06/22/2018 1255   ALT 21 06/22/2018 1255   ALKPHOS 42 06/22/2018 1255   BILITOT 0.8 06/22/2018 1255   GFRNONAA >60 06/22/2018 1255   GFRAA >60 06/22/2018 1255   No results found for: CHOL, HDL, LDLCALC, LDLDIRECT, TRIG, CHOLHDL No results found for: HGBA1C No results found for: VITAMINB12 No results found for: TSH    ASSESSMENT AND PLAN 59 y.o. year old male  has a past medical history of Inguinal hernia and Seizures ([place]). here with:  1.  Seizures, most recent seizure June 22, 2018  He has continued to do well since last seen, no recurrent seizure.  He will remain on Keppra 500 mg twice daily.  He will call for recurrent seizure, otherwise follow-up 1 year or sooner if needed.  I spent 20 minutes of face-to-face and non-face-to-face time with patient.  This included previsit chart review, lab review, study review, order entry, electronic health record documentation, patient education.  Butler Denmark, AGNP-C, DNP 07/18/2019, 1:25 PM [facility] [address] [place], [place] [address] [phone]

## 2019-12-05 MED FILL — levETIRAcetam 500 MG TABS: 500 | 90 days supply | Qty: 180 | Fill #1

## 2019-12-18 IMAGING — CT CT HEAD WITHOUT CONTRAST
4 series · 17 of 47 positions shown, 19 images · non-contrast
Comparison: None.

CLINICAL DATA: Altered level of consciousness.  Possible seizure.

EXAM:
CT HEAD WITHOUT CONTRAST
TECHNIQUE: Contiguous axial images were obtained from the base of the skull
through the vertex without intravenous contrast.

[Series 3: head without · axial · non-contrast · 0.43mm/px · z∈[-84,+36]mm · 7 of 33 slices shown, 9 images]
[im 5/33  brain]
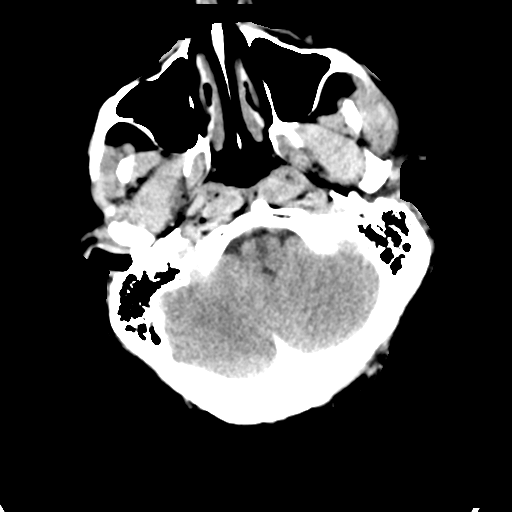
[im 5/33  bone]
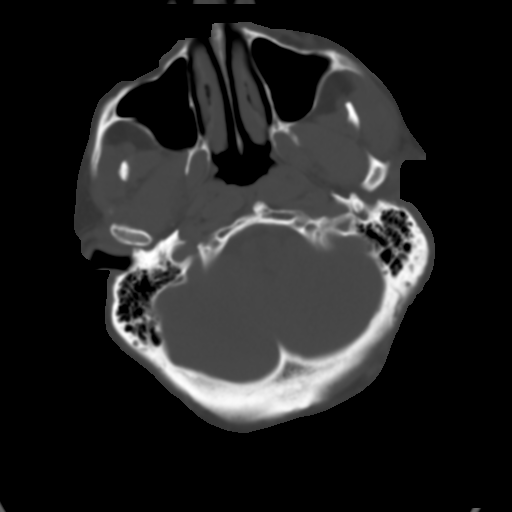
[im 9/33  brain]
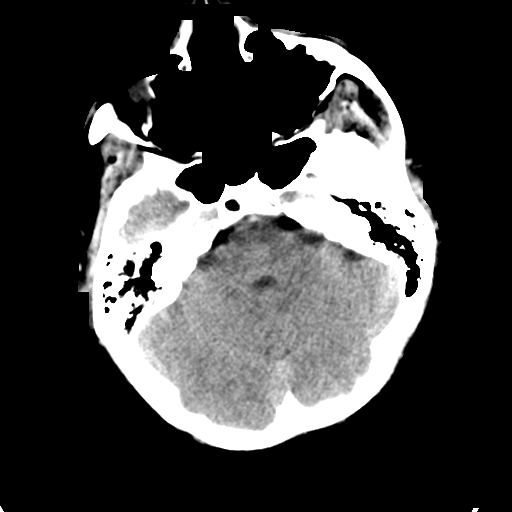
[im 13/33  brain]
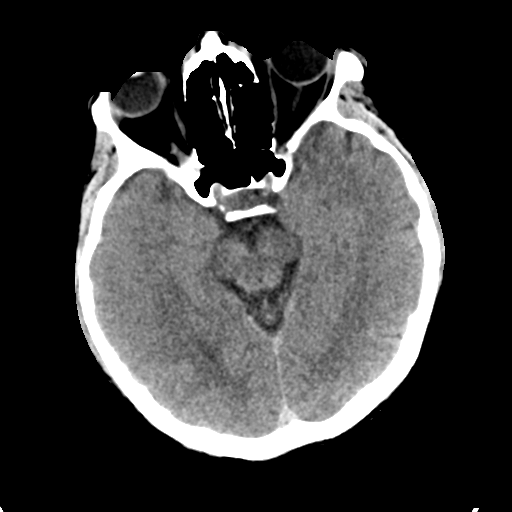
[im 17/33  brain]
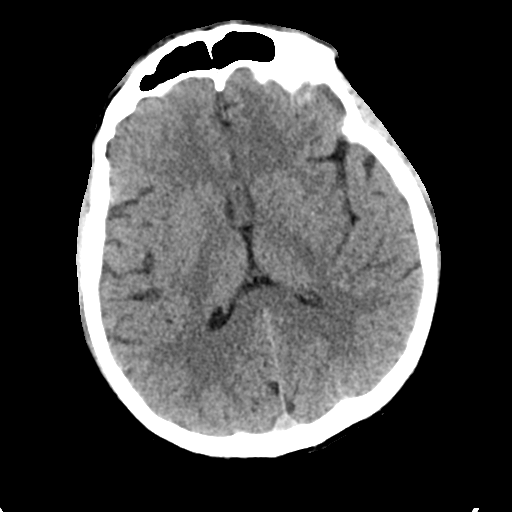
[im 21/33  brain]
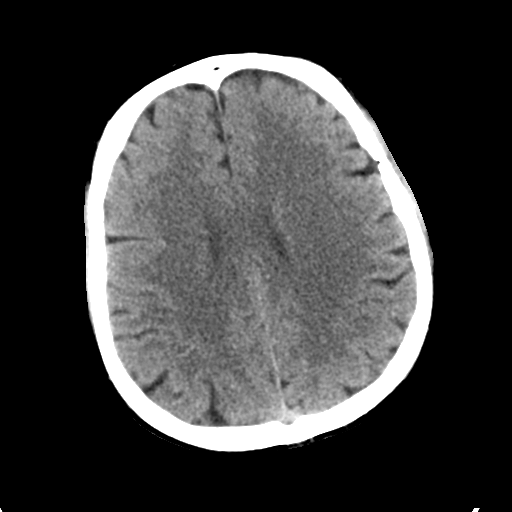
[im 21/33  bone]
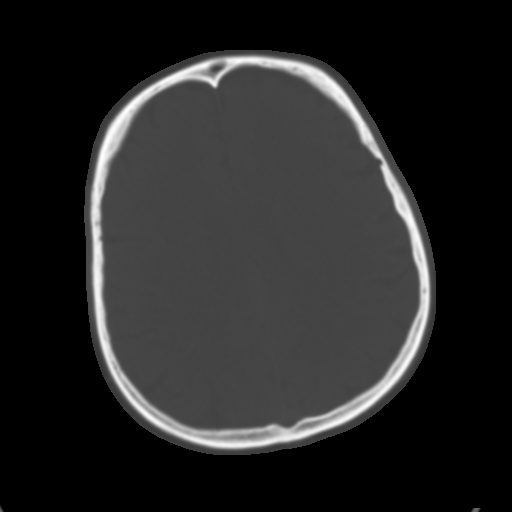
[im 25/33  brain]
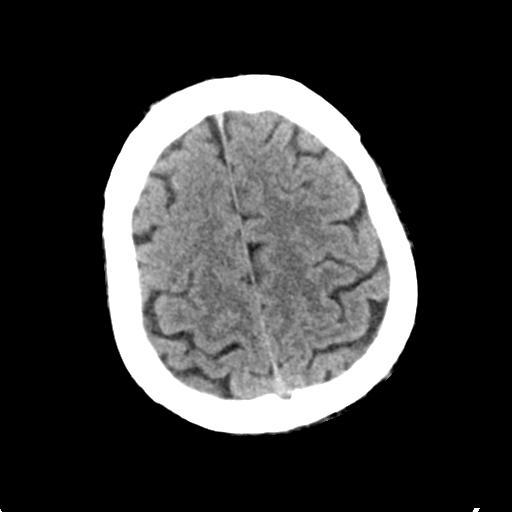
[im 29/33  brain]
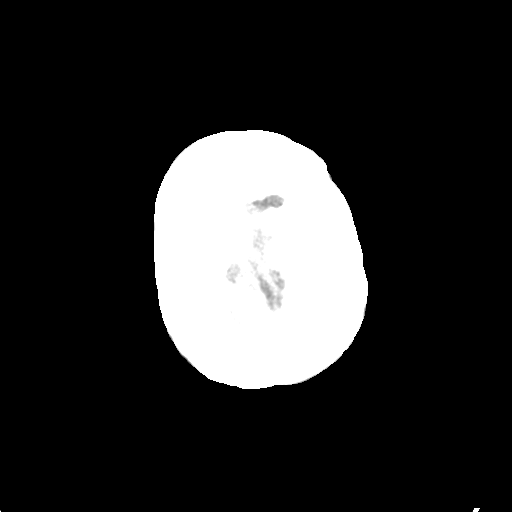

[Series 4: head bone · axial · 0.43mm/px · z∈[-88,-32]mm · 4 of 81 slices shown]
[im 9/81  bone]
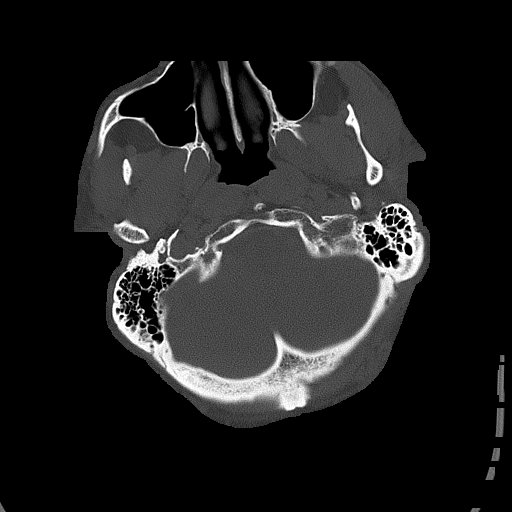
[im 17/81  bone]
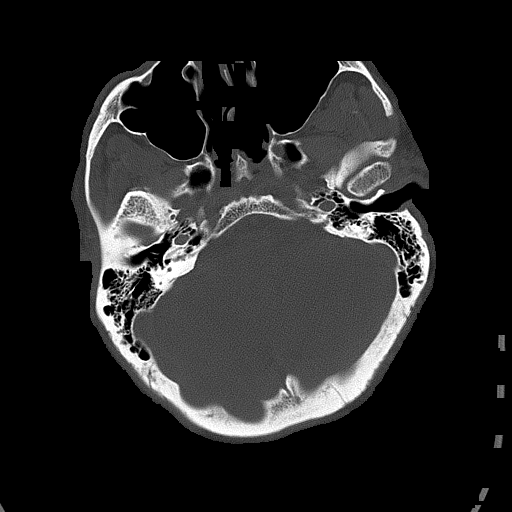
[im 25/81  bone]
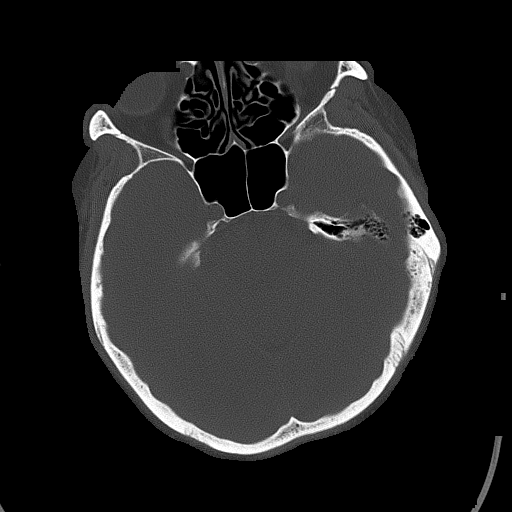
[im 37/81  bone]
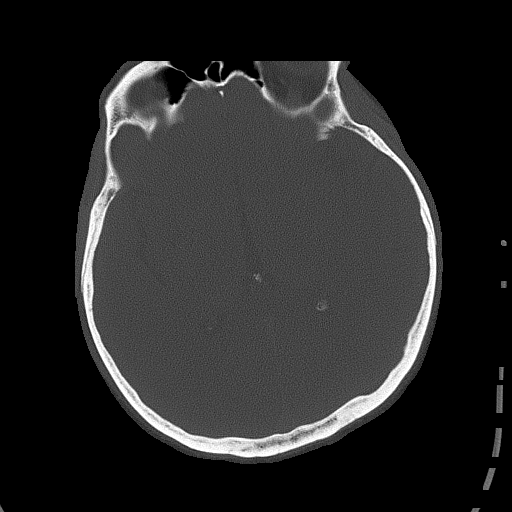

[Series 5: head without cor · coronal · non-contrast · 0.32mm/px · 3 of 67 slices shown]
[im 23/67  brain]
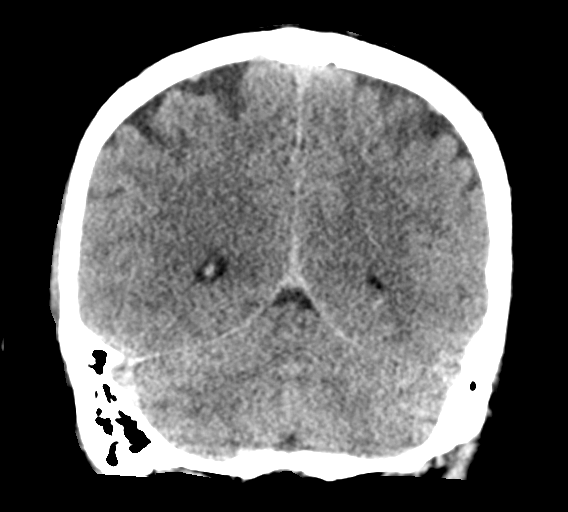
[im 30/67  brain]
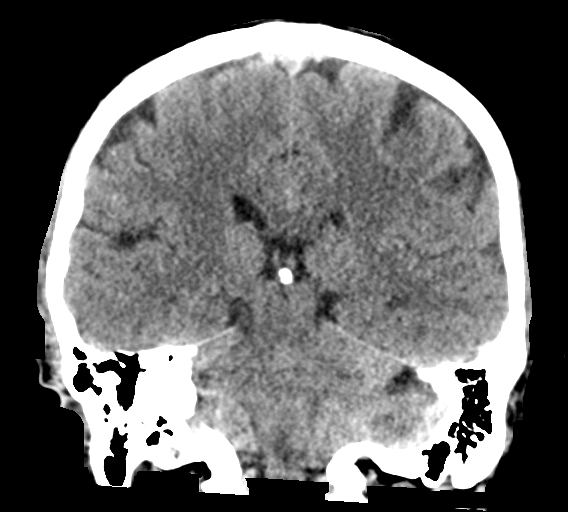
[im 37/67  brain]
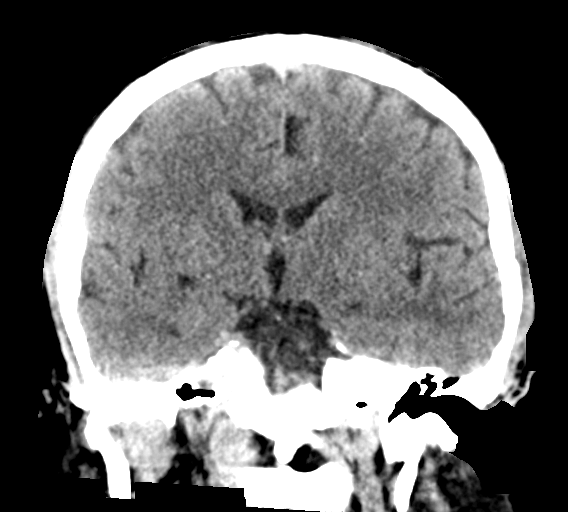

[Series 6: head without sag · sagittal · non-contrast · 0.31mm/px · 3 of 59 slices shown]
[im 20/59  brain]
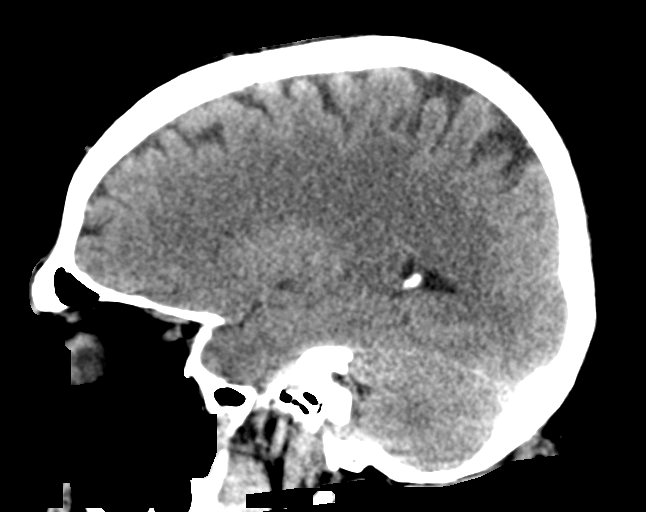
[im 30/59  brain]
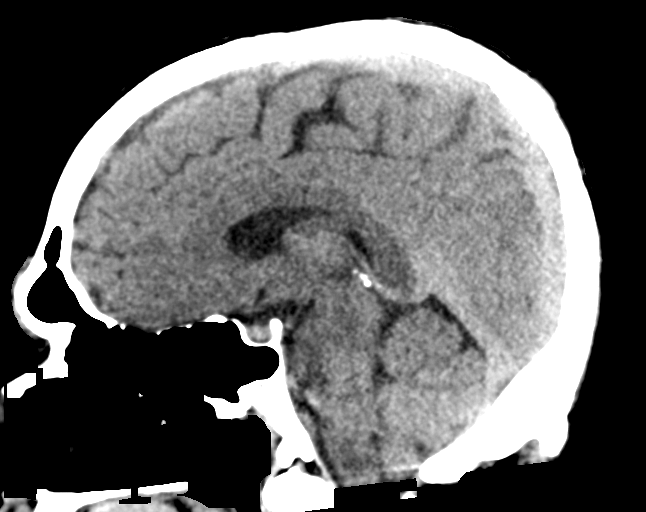
[im 39/59  brain]
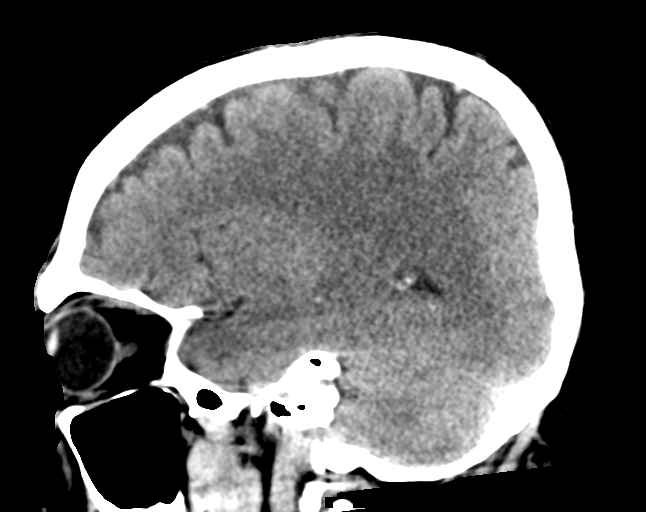

[17 of 47 positions shown; findings below may reference images not displayed]

FINDINGS: Brain: No evidence of acute infarction, hemorrhage, hydrocephalus,
extra-axial collection or mass lesion/mass effect.

Vascular: Negative for hyperdense vessel

Skull: Negative

Sinuses/Orbits: Mild mucosal edema right maxillary sinus otherwise
clear. Negative orbit

Other: None
IMPRESSION: Negative CT head

## 2020-03-09 MED FILL — levETIRAcetam 500 MG TABS: 500 | 90 days supply | Qty: 180 | Fill #2

## 2020-06-02 MED FILL — levETIRAcetam 500 MG TABS: 500 | 90 days supply | Qty: 180 | Fill #3

## 2020-06-22 ENCOUNTER — Other Ambulatory Visit (HOSPITAL_BASED_OUTPATIENT_CLINIC_OR_DEPARTMENT_OTHER): Payer: Self-pay

## 2020-07-20 NOTE — Progress Notes (Signed)
PATIENT: Kevin Short DOB: 07/23/1960  REASON FOR VISIT: follow up HISTORY FROM: patient  HISTORY OF PRESENT ILLNESS: Today 07/21/20  Kevin Short is a 60 year old male with history of primarily nocturnal seizures since he was 60 years old.  He was off seizure medication for several years.  In March 2020 had his first tonic-clonic daytime seizure.  Since then has been on Keppra.  No recurrent seizures since last seen.  He got married in June 2021.  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  Denies any new problems or concerns.  Here today for evaluation unaccompanied.  Update 07/18/2019 SS: Kevin Short is a 60 year old male with history of primarily nocturnal seizures since he was 60 years old.  On average he may have 2 or 3 seizures a year.  He went many years without being on anticonvulsant medications.  All of his seizures have been while sleeping, brought on by sleep deprivation or stress.  On June 22, 2018, he had his first daytime seizure, that was a tonic-clonic seizure.  He remains on Keppra.  He is tolerating without side effect.  He has not had recurrent seizure.  He will know if he had a nocturnal seizure, the next morning his calves will be sore and his tongue thawed.  He had Covid in February.  He is planning to get married in June.  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  He presents today for follow-up unaccompanied.  HISTORY  01/16/2019 SS: Kevin Short is a 60 year old male with history of primarily nocturnal seizures since he was 60 years old.  On average he may have 2 or 3 seizures a year.  He went many years without being on anticonvulsant medications.  All of his seizures have been while sleeping, brought on by sleep deprivation or stress.  He had his first daytime seizure on June 22, 2018.  He had a witnessed generalized tonic-clonic seizure at that time.  He was taken to the emergency room and placed on Keppra.  He had an  EEG study in June 2020 that was unremarkable.  Since last seen, he has not had recurrent seizure.  He remains on Keppra and is tolerating well.  He says he would know if he had a nocturnal seizure, because he would have stiffness in his calves and soreness to his tongue.  He has resumed operating a motor vehicle.  He continues to work full-time in the lab at [facility] [employer] [facility] [employer].  He presents today for follow-up unaccompanied.  REVIEW OF SYSTEMS: Out of a complete 14 system review of symptoms, the patient complains only of the following symptoms, and all other reviewed systems are negative.  Seizure  ALLERGIES: No Known Allergies  HOME MEDICATIONS: Outpatient Medications Prior to Visit  Medication Sig Dispense Refill  . Multiple Vitamin (MULTIVITAMIN WITH MINERALS) TABS tablet Take 1 tablet by mouth every morning.    . levETIRAcetam (KEPPRA) 500 MG tablet Take 1 tablet (500 mg total) by mouth 2 (two) times daily. 180 tablet 4  . levETIRAcetam (KEPPRA) 500 MG tablet TAKE 1 TABLET BY MOUTH TWICE DAILY 180 tablet 4   No facility-administered medications prior to visit.    PAST MEDICAL HISTORY: Past Medical History:  Diagnosis Date  . Inguinal hernia    RIGHT  . Seizures (HCC)     PAST SURGICAL HISTORY: Past Surgical History:  Procedure Laterality Date  . HERNIA REPAIR  1995  LEFT INGUINAL  . INGUINAL HERNIA REPAIR Bilateral 12/11/2014   Procedure: LAPAROSCOPIC BILATERAL INGUINAL AND BILATERAL FEMEROL  HERNIA  REPAIRS WITH MESH;  Surgeon: Karie Soda, MD;  Location: [facility];  Service: General;  Laterality: Bilateral;  . INSERTION OF MESH Bilateral 12/11/2014   Procedure: INSERTION OF MESH;  Surgeon: Karie Soda, MD;  Location: [facility];  Service: General;  Laterality: Bilateral;  . SHOULDER SURGERY  2005   RT    FAMILY HISTORY: Family History  Problem Relation Age of Onset  . Heart attack Mother   . Heart attack Father   . Healthy Sister   . Healthy Sister   . Healthy Sister      SOCIAL HISTORY: Social History   Socioeconomic History  . Marital status: Single    Spouse name: Not on file  . Number of children: Not on file  . Years of education: Not on file  . Highest education level: Not on file  Occupational History  . Not on file  Tobacco Use  . Smoking status: Never Smoker  . Smokeless tobacco: Never Used  Vaping Use  . Vaping Use: Never used  Substance and Sexual Activity  . Alcohol use: Yes    Alcohol/week: 3.0 standard drinks    Types: 3 Cans of beer per week    Comment: OCCASIONAL  . Drug use: Never  . Sexual activity: Not on file  Other Topics Concern  . Not on file  Social History Narrative   ** Merged History Encounter **       Social Determinants of Health   Financial Resource Strain: Not on file  Food Insecurity: Not on file  Transportation Needs: Not on file  Physical Activity: Not on file  Stress: Not on file  Social Connections: Not on file  Intimate Partner Violence: Not on file   PHYSICAL EXAM  Vitals:   07/21/20 1317  BP: (!) 162/99  Pulse: 60  Weight: 206 lb (93.4 kg)  Height: 5\' 11"  (1.803 m)   Body mass index is 28.73 kg/m.  Generalized: Well developed, in no acute distress   Neurological examination  Mentation: Alert oriented to time, place, history taking. Follows all commands speech and language fluent Cranial nerve II-XII: Pupils were equal round reactive to light. Extraocular movements were full, visual field were full on confrontational test. Facial sensation and strength were normal. Head turning and shoulder shrug  were normal and symmetric. Motor: The motor testing reveals 5 over 5 strength of all 4 extremities. Good symmetric motor tone is noted throughout.  Sensory: Sensory testing is intact to soft touch on all 4 extremities. No evidence of extinction is noted.  Coordination: Cerebellar testing reveals good finger-nose-finger and heel-to-shin bilaterally.  Gait and station: Gait is normal.   Reflexes: Deep tendon reflexes are symmetric and normal bilaterally.   DIAGNOSTIC DATA (LABS, IMAGING, TESTING) - I reviewed patient records, labs, notes, testing and imaging myself where available.  Lab Results  Component Value Date   WBC 8.4 06/22/2018   HGB 16.1 06/22/2018   HCT 50.7 06/22/2018   MCV 101.0 (H) 06/22/2018   PLT 236 06/22/2018      Component Value Date/Time   NA 142 06/22/2018 1255   K 3.6 06/22/2018 1255   CL 102 06/22/2018 1255   CO2 13 (L) 06/22/2018 1255   GLUCOSE 131 (H) 06/22/2018 1255   BUN 11 06/22/2018 1255   CREATININE 1.22 06/22/2018 1255   CALCIUM 9.8 06/22/2018 1255   PROT 7.7 06/22/2018  1255   ALBUMIN 4.9 06/22/2018 1255   AST 27 06/22/2018 1255   ALT 21 06/22/2018 1255   ALKPHOS 42 06/22/2018 1255   BILITOT 0.8 06/22/2018 1255   GFRNONAA >60 06/22/2018 1255   GFRAA >60 06/22/2018 1255   No results found for: CHOL, HDL, LDLCALC, LDLDIRECT, TRIG, CHOLHDL No results found for: KGSU1J No results found for: VITAMINB12 No results found for: TSH  ASSESSMENT AND PLAN 60 y.o. year old male  has a past medical history of Inguinal hernia and Seizures (HCC). here with:  1.  Seizures, most recent seizure June 22, 2018  -No recurrent seizures since last seen -Continue Keppra 500 mg twice a day -Recommended schedule physical with PCP, BP up today -Call for seizure activity, otherwise follow-up 1 year or sooner if needed, ok for 15 min VV visit  I spent 20 minutes of face-to-face and non-face-to-face time with patient.  This included previsit chart review, lab review, study review, order entry, electronic health record documentation, patient education.  Margie Ege, AGNP-C, DNP 07/21/2020, 1:41 PM [facility] [address], [address] [place], [place] [address] [phone]

## 2020-07-21 ENCOUNTER — Ambulatory Visit: Payer: 59 | Admitting: Neurology

## 2020-07-21 ENCOUNTER — Other Ambulatory Visit (HOSPITAL_COMMUNITY): Payer: Self-pay

## 2020-07-21 ENCOUNTER — Other Ambulatory Visit: Payer: Self-pay

## 2020-07-21 ENCOUNTER — Encounter: Payer: Self-pay | Admitting: Neurology

## 2020-07-21 VITALS — BP 162/99 | HR 60 | Ht 71.0 in | Wt 206.0 lb

## 2020-07-21 DIAGNOSIS — R569 Unspecified convulsions: Secondary | ICD-10-CM

## 2020-07-21 MED ORDER — LEVETIRACETAM 500 MG PO TABS
500.0000 mg | ORAL_TABLET | Freq: Two times a day (BID) | ORAL | 4 refills | Status: DC
  Filled 2020-07-21 – 2020-09-07 (×2): qty 180, 90d supply, fill #0
  Filled 2020-12-04: qty 180, 90d supply, fill #1
  Filled 2021-03-06: qty 180, 90d supply, fill #2
  Filled 2021-06-03: qty 180, 90d supply, fill #3

## 2020-07-21 NOTE — Patient Instructions (Signed)
Continue Keppra at current dosing Call for seizure activity  See you back in 1 year

## 2020-07-21 NOTE — Progress Notes (Signed)
I have read the note, and I agree with the clinical assessment and plan.  Ki Corbo K Capone Schwinn   

## 2020-09-07 ENCOUNTER — Other Ambulatory Visit (HOSPITAL_COMMUNITY): Payer: Self-pay

## 2020-12-04 ENCOUNTER — Other Ambulatory Visit (HOSPITAL_COMMUNITY): Payer: Self-pay

## 2021-03-06 ENCOUNTER — Other Ambulatory Visit (HOSPITAL_COMMUNITY): Payer: Self-pay

## 2021-03-08 ENCOUNTER — Other Ambulatory Visit (HOSPITAL_COMMUNITY): Payer: Self-pay

## 2021-06-03 ENCOUNTER — Other Ambulatory Visit (HOSPITAL_COMMUNITY): Payer: Self-pay

## 2021-07-26 ENCOUNTER — Telehealth: Payer: 59 | Admitting: Neurology

## 2021-07-28 ENCOUNTER — Other Ambulatory Visit (HOSPITAL_COMMUNITY): Payer: Self-pay

## 2021-07-28 ENCOUNTER — Encounter: Payer: Self-pay | Admitting: Neurology

## 2021-07-28 ENCOUNTER — Telehealth (INDEPENDENT_AMBULATORY_CARE_PROVIDER_SITE_OTHER): Payer: 59 | Admitting: Neurology

## 2021-07-28 DIAGNOSIS — R569 Unspecified convulsions: Secondary | ICD-10-CM

## 2021-07-28 MED ORDER — LEVETIRACETAM 500 MG PO TABS
500.0000 mg | ORAL_TABLET | Freq: Two times a day (BID) | ORAL | 4 refills | Status: DC
  Filled 2021-07-28 – 2021-08-31 (×2): qty 180, 90d supply, fill #0
  Filled 2021-11-29: qty 180, 90d supply, fill #1
  Filled 2022-03-03: qty 180, 90d supply, fill #2
  Filled 2022-05-31: qty 180, 90d supply, fill #3

## 2021-07-28 NOTE — Progress Notes (Signed)
? ?  Virtual Visit via Video Note ? ?I connected with Kevin Short on 07/28/21 at  2:15 PM EDT by a video enabled telemedicine application and verified that I am speaking with the correct person using two identifiers. ? ?Location: ?Patient: at his home ?Provider: in the office  ?  ?I discussed the limitations of evaluation and management by telemedicine and the availability of in person appointments. The patient expressed understanding and agreed to proceed. ? ?History of Present Illness: ?07/28/21 SS: Kevin Short here today for follow-up.  Continues to do well on Keppra.  No recurrent seizures.  Denies any changes to his health.  He has been busy working in his yard.  He turns 61 in the next couple weeks. ? ?07/21/2020 SS: Kevin Short is a 61 year old male with history of primarily nocturnal seizures since he was 61 years old.  He was off seizure medication for several years.  In March 2020 had his first tonic-clonic daytime seizure.  Since then has been on Keppra.  No recurrent seizures since last seen.  He got married in June 2021.  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  Denies any new problems or concerns.  Here today for evaluation unaccompanied. ?  ?Observations/Objective: ?Via virtual visit, is alert and oriented, speech is clear and concise, excellent historian, facial symmetry noted, follows commands, can move all extremities ? ?Assessment and Plan: ?1.  Seizure ?-Most recent in March 2020 ?-Continue Keppra 500 mg twice a day ?-Follow-up 1 year or sooner if needed ? ?Follow Up Instructions: ?08/03/2022 2:15 ?  ?I discussed the assessment and treatment plan with the patient. The patient was provided an opportunity to ask questions and all were answered. The patient agreed with the plan and demonstrated an understanding of the instructions. ?  ?The patient was advised to call back or seek an in-person evaluation if the symptoms worsen or if the condition fails to improve as  anticipated. ? ?Margie Ege, AGNP-C, DNP  ?[facility] ?[address], [address] ?[place], [place] [address] ?[phone] ? ?

## 2021-08-31 ENCOUNTER — Other Ambulatory Visit (HOSPITAL_COMMUNITY): Payer: Self-pay

## 2021-09-01 ENCOUNTER — Other Ambulatory Visit (HOSPITAL_COMMUNITY): Payer: Self-pay

## 2021-11-29 ENCOUNTER — Other Ambulatory Visit (HOSPITAL_COMMUNITY): Payer: Self-pay

## 2022-03-03 ENCOUNTER — Other Ambulatory Visit (HOSPITAL_COMMUNITY): Payer: Self-pay

## 2022-06-01 ENCOUNTER — Other Ambulatory Visit: Payer: Self-pay

## 2022-08-03 ENCOUNTER — Telehealth: Payer: Commercial Managed Care - PPO | Admitting: Neurology

## 2022-08-03 ENCOUNTER — Other Ambulatory Visit (HOSPITAL_COMMUNITY): Payer: Self-pay

## 2022-08-03 DIAGNOSIS — R569 Unspecified convulsions: Secondary | ICD-10-CM | POA: Diagnosis not present

## 2022-08-03 MED ORDER — LEVETIRACETAM 500 MG PO TABS
500.0000 mg | ORAL_TABLET | Freq: Two times a day (BID) | ORAL | 4 refills | Status: DC
  Filled 2022-08-03 – 2022-09-01 (×2): qty 180, 90d supply, fill #0
  Filled 2022-12-06: qty 180, 90d supply, fill #1

## 2022-08-03 NOTE — Patient Instructions (Signed)
Great to see you today, we will continue the Keppra, let me know if you have any seizures.  See you in 1 year virtually :)

## 2022-08-03 NOTE — Progress Notes (Signed)
   Virtual Visit via Video Note  I connected with Kevin Short on 08/03/22 at  2:15 PM EDT by a video enabled telemedicine application and verified that I am speaking with the correct person using two identifiers.  Location: Patient: at his home Provider: in the office    I discussed the limitations of evaluation and management by telemedicine and the availability of in person appointments. The patient expressed understanding and agreed to proceed.  History of Present Illness: Aug 03, 2022 SS: Here today for follow up. No seizures. Remains on Keppra 500 mg twice daily. No health issues or concerns.  He did mention potentially coming off seizure medication.  Denies any side effects.  Last seizure was in May 2020.  07/28/21 SS: Kevin Short here today for follow-up.  Continues to do well on Keppra.  No recurrent seizures.  Denies any changes to his health.  He has been busy working in his yard.  He turns 61 in the next couple weeks.  07/21/2020 SS: Kevin Short is a 62 year old male with history of primarily nocturnal seizures since he was 62 years old.  He was off seizure medication for several years.  In March 2020 had his first tonic-clonic daytime seizure.  Since then has been on Keppra.  No recurrent seizures since last seen.  He got married in June 2021.  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  Denies any new problems or concerns.  Here today for evaluation unaccompanied.   Observations/Objective: Via virtual visit, is alert and oriented, speech is clear and concise, excellent historian, facial symmetry noted, follows commands, can move all extremities  Assessment and Plan: 1.  Seizure -Most recent in March 2020 -Continue Keppra 500 mg twice a day, I think he should remain on seizure medication given his long seizure of seizures, mostly nocturnal, first daytime tonic-clonic seizure in March 2020 -Follow-up 1 year or sooner if needed  Meds ordered this encounter   Medications   levETIRAcetam (KEPPRA) 500 MG tablet    Sig: Take 1 tablet (500 mg total) by mouth 2 (two) times daily.    Dispense:  180 tablet    Refill:  4   Follow Up Instructions: 08/03/2022 2:15   I discussed the assessment and treatment plan with the patient. The patient was provided an opportunity to ask questions and all were answered. The patient agreed with the plan and demonstrated an understanding of the instructions.   The patient was advised to call back or seek an in-person evaluation if the symptoms worsen or if the condition fails to improve as anticipated.  Otila Kluver, DNP  [facility] [address] [place], [place] [address] [phone]

## 2022-09-01 ENCOUNTER — Encounter: Payer: Self-pay | Admitting: Pharmacist

## 2022-09-01 ENCOUNTER — Other Ambulatory Visit: Payer: Self-pay

## 2022-09-01 ENCOUNTER — Other Ambulatory Visit (HOSPITAL_COMMUNITY): Payer: Self-pay

## 2022-09-02 ENCOUNTER — Encounter (HOSPITAL_COMMUNITY): Payer: Self-pay

## 2022-09-02 ENCOUNTER — Other Ambulatory Visit (HOSPITAL_COMMUNITY): Payer: Self-pay

## 2022-09-02 ENCOUNTER — Other Ambulatory Visit (HOSPITAL_BASED_OUTPATIENT_CLINIC_OR_DEPARTMENT_OTHER): Payer: Self-pay

## 2022-12-06 ENCOUNTER — Other Ambulatory Visit: Payer: Self-pay

## 2023-03-01 ENCOUNTER — Other Ambulatory Visit: Payer: Self-pay

## 2023-03-01 MED ORDER — LEVETIRACETAM 500 MG PO TABS: 500.0000 mg | ORAL_TABLET | Freq: Two times a day (BID) | ORAL | 4 refills | Status: DC

## 2023-08-09 ENCOUNTER — Telehealth: Payer: Commercial Managed Care - PPO | Admitting: Neurology

## 2023-08-09 DIAGNOSIS — R569 Unspecified convulsions: Secondary | ICD-10-CM

## 2023-08-09 MED ORDER — LEVETIRACETAM 500 MG PO TABS: 500.0000 mg | ORAL_TABLET | Freq: Two times a day (BID) | ORAL | 4 refills | Status: AC

## 2023-08-09 NOTE — Patient Instructions (Signed)
 Great to see you today.  Please continue Keppra  for seizure prevention.  I have sent in a refill.  Please call for seizures.  Follow-up in 1 year.  Thanks!!

## 2023-08-09 NOTE — Progress Notes (Signed)
   Virtual Visit via Video Note  I connected with Kevin Short on 08/09/23 at  2:15 PM EDT by a video enabled telemedicine application and verified that I am speaking with the correct person using two identifiers.  Location: Patient: at his home Provider: in the office    I discussed the limitations of evaluation and management by telemedicine and the availability of in person appointments. The patient expressed understanding and agreed to proceed.  History of Present Illness: Update Aug 09, 2023 SS: Via VV. No seizures. Remains on Keppra  500 mg twice daily. No issues or side effects. Health has been good. Did some traveling this year.   Aug 03, 2022 SS: Here today for follow up. No seizures. Remains on Keppra  500 mg twice daily. No health issues or concerns.  He did mention potentially coming off seizure medication.  Denies any side effects.  Last seizure was in May 2020.  07/28/21 SS: Kevin Short here today for follow-up.  Continues to do well on Keppra .  No recurrent seizures.  Denies any changes to his health.  He has been busy working in his yard.  He turns 63 in the next couple weeks.  07/21/2020 SS: Kevin Short is a 63 year old male with history of primarily nocturnal seizures since he was 63 years old.  He was off seizure medication for several years.  In March 2020 had his first tonic-clonic daytime seizure.  Since then has been on Keppra .  No recurrent seizures since last seen.  He got married in June 2021.  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  Denies any new problems or concerns.  Here today for evaluation unaccompanied.   Observations/Objective: Via virtual visit, is alert and oriented, speech is clear and concise, excellent historian, facial symmetry noted, follows commands, moves about freely  Assessment and Plan: 1.  Seizure -Most recent in March 2020 -Continue Keppra  500 mg twice a day, long seizure of seizures, mostly nocturnal, first daytime  tonic-clonic seizure in March 2020 -Follow-up 1 year or sooner if needed, call for seizures  Meds ordered this encounter  Medications   levETIRAcetam  (KEPPRA ) 500 MG tablet    Sig: Take 1 tablet (500 mg total) by mouth 2 (two) times daily.    Dispense:  180 tablet    Refill:  4   Follow Up Instructions: 1 year virtually   I discussed the assessment and treatment plan with the patient. The patient was provided an opportunity to ask questions and all were answered. The patient agreed with the plan and demonstrated an understanding of the instructions.   The patient was advised to call back or seek an in-person evaluation if the symptoms worsen or if the condition fails to improve as anticipated.  Cortland Ding, DNP  [facility] [address], [address] [place], [place] [address] [phone]

## 2024-08-14 ENCOUNTER — Telehealth: Admitting: Neurology
